# Patient Record
Sex: Male | Born: 1970 | Race: White | Hispanic: No | Marital: Married | Smoking: Never smoker
Health system: Southern US, Community
[De-identification: ages and names within clinical notes are randomized; demographics above are authoritative.]

## PROBLEM LIST (undated history)

## (undated) DIAGNOSIS — K5900 Constipation, unspecified: Secondary | ICD-10-CM

## (undated) DIAGNOSIS — R1310 Dysphagia, unspecified: Secondary | ICD-10-CM

## (undated) DIAGNOSIS — J342 Deviated nasal septum: Secondary | ICD-10-CM

## (undated) DIAGNOSIS — K589 Irritable bowel syndrome without diarrhea: Secondary | ICD-10-CM

## (undated) DIAGNOSIS — E785 Hyperlipidemia, unspecified: Secondary | ICD-10-CM

## (undated) DIAGNOSIS — K219 Gastro-esophageal reflux disease without esophagitis: Secondary | ICD-10-CM

## (undated) DIAGNOSIS — K649 Unspecified hemorrhoids: Secondary | ICD-10-CM

## (undated) DIAGNOSIS — E559 Vitamin D deficiency, unspecified: Secondary | ICD-10-CM

## (undated) DIAGNOSIS — E509 Vitamin A deficiency, unspecified: Secondary | ICD-10-CM

## (undated) DIAGNOSIS — K5790 Diverticulosis of intestine, part unspecified, without perforation or abscess without bleeding: Secondary | ICD-10-CM

## (undated) HISTORY — DX: Vitamin D deficiency, unspecified: E55.9

## (undated) HISTORY — DX: Constipation, unspecified: K59.00

## (undated) HISTORY — DX: Dysphagia, unspecified: R13.10

## (undated) HISTORY — DX: Irritable bowel syndrome, unspecified: K58.9

## (undated) HISTORY — DX: Gastro-esophageal reflux disease without esophagitis: K21.9

## (undated) HISTORY — DX: Vitamin a deficiency, unspecified: E50.9

## (undated) HISTORY — DX: Diverticulosis of intestine, part unspecified, without perforation or abscess without bleeding: K57.90

## (undated) HISTORY — DX: Hyperlipidemia, unspecified: E78.5

## (undated) HISTORY — DX: Unspecified hemorrhoids: K64.9

## (undated) HISTORY — PX: WISDOM TOOTH EXTRACTION: SHX21

## (undated) HISTORY — DX: Deviated nasal septum: J34.2

---

## 2002-02-10 ENCOUNTER — Emergency Department (HOSPITAL_COMMUNITY): Admission: EM | Admit: 2002-02-10 | Discharge: 2002-02-10 | Payer: Self-pay | Admitting: Emergency Medicine

## 2002-02-10 ENCOUNTER — Encounter: Payer: Self-pay | Admitting: Emergency Medicine

## 2002-02-14 ENCOUNTER — Encounter: Payer: Self-pay | Admitting: Emergency Medicine

## 2002-02-14 ENCOUNTER — Ambulatory Visit (HOSPITAL_COMMUNITY): Admission: RE | Admit: 2002-02-14 | Discharge: 2002-02-14 | Payer: Self-pay | Admitting: Emergency Medicine

## 2003-07-16 ENCOUNTER — Ambulatory Visit (HOSPITAL_BASED_OUTPATIENT_CLINIC_OR_DEPARTMENT_OTHER): Admission: RE | Admit: 2003-07-16 | Discharge: 2003-07-16 | Payer: Self-pay | Admitting: Otolaryngology

## 2005-04-10 HISTORY — PX: NASAL SEPTUM SURGERY: SHX37

## 2006-09-12 ENCOUNTER — Encounter: Admission: RE | Admit: 2006-09-12 | Discharge: 2006-09-12 | Payer: Self-pay | Admitting: Otolaryngology

## 2007-10-04 ENCOUNTER — Encounter: Admission: RE | Admit: 2007-10-04 | Discharge: 2007-10-04 | Payer: Self-pay | Admitting: Gastroenterology

## 2007-12-03 HISTORY — PX: ESOPHAGOGASTRODUODENOSCOPY: SHX1529

## 2010-05-01 ENCOUNTER — Encounter: Payer: Self-pay | Admitting: Gastroenterology

## 2010-08-26 NOTE — Op Note (Signed)
NAME:  Scott Hensley, Scott Hensley                              ACCOUNT NO.:  [id number]   MEDICAL RECORD NO.:  [id number]                   PATIENT TYPE:  AMB   LOCATION:  DSC                                  FACILITY:  [facility]   PHYSICIAN:  Onalee Hua L. Annalee Genta, M.D.            DATE OF BIRTH:  1970/06/01   DATE OF PROCEDURE:  07/16/2003  DATE OF DISCHARGE:                                 OPERATIVE REPORT   PREOPERATIVE DIAGNOSIS:  1. Deviated nasal septum with nasal obstruction.  2. Bilateral inferior turbinate hypertrophy.   POSTOPERATIVE DIAGNOSIS:  1. Deviated nasal septum with nasal obstruction.  2. Bilateral inferior turbinate hypertrophy.   PROCEDURE:  1. Nasal septoplasty.  2. Bilateral inferior turbinate intramural cautery.   ANESTHESIA:  General endotracheal anesthesia.   SURGEON:  Kinnie Scales. Annalee Genta, M.D.   COMPLICATIONS:  None.   ESTIMATED BLOOD LOSS:  Approximately 50 mL.   DISPOSITION:  The patient is transferred from the operating room to the  recovery room in stable condition.  The patient will follow up in my office  in one week for postoperative care.   BRIEF HISTORY:  Mr. Heidrick is a 40 year old white male who was referred for  evaluation of progressive nasal airway obstruction.  Examination in the  office revealed a severely deviated nasal septum and turbinate hypertrophy.  The patient was treated with topical nasal steroids, antihistamines, and  decongestants and, despite this therapy, continued to have significant  problems with airway obstruction.  Given his history and examination, I  recommended that we consider him for nasal septoplasty and turbinate  reduction under general anesthesia.  The risks, benefits, and possible  complications of the procedure were discussed in detail with the patient who  understood and concurred with our plan for surgery which was scheduled as  above.   SURGICAL PROCEDURE:  The patient was brought to the operating room on July 16, 2003, and placed in supine position on the operating table. General  endotracheal anesthesia was established without difficulty and the patient  was adequately anesthetized.  His nose was injected with a total of 10 mL of  1% lidocaine with 1:100,000 epinephrine injected in a submucosal fashion  along the nasal septum and inferior turbinates bilaterally.  The patient's  nose was then packed with Afrin soaked cottonoid pledgets which were left in  place for approximately 10 minutes for vasoconstriction.  He was prepped and  draped in a sterile fashion.   The surgical procedure was begun by creating a left hemitransfixion incision  after removal of the cottonoid pledgets.  The hemitransfixion incision was  carried through the mucosa and underlying submucosa with a #15 scalpel.  A  mucoperichondrial flap was elevated along the patient's left hand side using  suction elevator.  The bony cartilaginous junction was crossed at the  midline and mucoperiosteal flap was elevated along the patient's right hand  side.  Deviated bone and cartilage in the mid and posterior aspects of the  nasal septum were then removed using a swivel knife.  Dorsal and columellar  septal cartilage was not deviated and this was left intact.  The patient had  a very large cartilaginous and bony septal spur on the left hand side which  was mobilized with a 4 mm osteotome after elevation of the overlying mucosa  which was preserved.  Deviated bone and cartilage along the septal spur were  resected and the septum was brought to the midline.  The resected cartilage  was morselized and returned to the mucoperichondrial pocket and the  mucoperichondrial flaps were reapproximated with a 4-0 gut suture on a Keith  needle in horizontal mattress fashion.  The hemitransfixion suture was  closed with the same stitch and bilateral Doyle nasal septal splints were  placed after the application of Bactroban ointment and sutured in  position  with a 3-0 Ethilon suture.   Bilateral inferior turbinate intramural cautery was performed with the  cautery set at 12 watts.  Two passes were made in a submucosal fashion in  each inferior turbinate.  When the turbinates had been adequately  cauterized, they were out fractured to create a more patent nasal cavity.  The patient's nasal cavity was then irrigated and suctioned.  An orogastric  tube was passed and the stomach contents were aspirated.  The oral cavity  was suctioned.  The patient was awakened from his anesthetic, extubated, and  transferred from the operating room to the recovery room in stable  condition.  There were no complications.  Blood loss less than 50 mL.                                               Kinnie Scales. Annalee Genta, M.D.    DLS/MEDQ  D:  04/54/0981  T:  07/16/2003  Job:  [id number]

## 2010-09-09 LAB — HM COLONOSCOPY

## 2010-10-09 DIAGNOSIS — K5790 Diverticulosis of intestine, part unspecified, without perforation or abscess without bleeding: Secondary | ICD-10-CM

## 2010-10-09 HISTORY — DX: Diverticulosis of intestine, part unspecified, without perforation or abscess without bleeding: K57.90

## 2010-10-09 HISTORY — PX: COLONOSCOPY: SHX174

## 2010-11-07 ENCOUNTER — Other Ambulatory Visit: Payer: Self-pay | Admitting: Gastroenterology

## 2010-11-08 ENCOUNTER — Ambulatory Visit
Admission: RE | Admit: 2010-11-08 | Discharge: 2010-11-08 | Disposition: A | Discharge status: Home / Self Care | Payer: BC Managed Care – PPO | Source: Ambulatory Visit | Attending: Gastroenterology | Admitting: Gastroenterology

## 2010-11-08 MED ORDER — IOHEXOL 300 MG/ML  SOLN
125.0000 mL | Freq: Once | INTRAMUSCULAR | Status: AC | PRN
  Administered 2010-11-08: 125 mL via INTRAVENOUS

## 2011-07-19 ENCOUNTER — Encounter: Payer: Self-pay | Admitting: Family Medicine

## 2011-07-19 ENCOUNTER — Ambulatory Visit (INDEPENDENT_AMBULATORY_CARE_PROVIDER_SITE_OTHER): Payer: BC Managed Care – PPO | Admitting: Family Medicine

## 2011-07-19 VITALS — BP 100/68 | HR 68 | Ht 76.0 in | Wt 278.0 lb

## 2011-07-19 DIAGNOSIS — R141 Gas pain: Secondary | ICD-10-CM

## 2011-07-19 DIAGNOSIS — H1030 Unspecified acute conjunctivitis, unspecified eye: Secondary | ICD-10-CM

## 2011-07-19 DIAGNOSIS — E7800 Pure hypercholesterolemia, unspecified: Secondary | ICD-10-CM | POA: Insufficient documentation

## 2011-07-19 DIAGNOSIS — K5730 Diverticulosis of large intestine without perforation or abscess without bleeding: Secondary | ICD-10-CM

## 2011-07-19 DIAGNOSIS — R7301 Impaired fasting glucose: Secondary | ICD-10-CM | POA: Insufficient documentation

## 2011-07-19 DIAGNOSIS — R140 Abdominal distension (gaseous): Secondary | ICD-10-CM | POA: Insufficient documentation

## 2011-07-19 DIAGNOSIS — H6980 Other specified disorders of Eustachian tube, unspecified ear: Secondary | ICD-10-CM

## 2011-07-19 DIAGNOSIS — K5790 Diverticulosis of intestine, part unspecified, without perforation or abscess without bleeding: Secondary | ICD-10-CM | POA: Insufficient documentation

## 2011-07-19 MED ORDER — POLYMYXIN B-TRIMETHOPRIM 10000-0.1 UNIT/ML-% OP SOLN: 1.0000 [drp] | OPHTHALMIC | Status: AC

## 2011-07-19 NOTE — Progress Notes (Signed)
Chief complaint:  Bloated and gassy feeling, and occasional constipation. Has seen GI for these issues but would like to discuss. Thinks he may have a hemmorhoid. Also has pink eye and his ears feel full. Right leg has a pain that shoots down his leg when he gets out of bed  HPI: Bloating and gassiness--has seen GI.  Hycosamine didn't really help. He has been diagnosed with irritable bowel syndrome and diverticulosis.  Hasn't tried Gas-X for his discomfort.  Also complains of constipation--strains, sometimes has bright red blood on toilet paper after having bowel movement.  Doesn't feel like he completely empties.  Also has some rectal itching.  Hyperlipidemia--previously took pravastatin.  Has been off meds for about a year.  Has been eating better since lent, and has lost 15-20 pounds.  Recalls that sugar was slightly elevated on last check also. Not fasting today.  L eye started getting red yesterday. Got worse throughout the day, and this morning was crusted over.  +itchy today. Denies sick contact.  Also complains of L ear pressure--ears plug and pop a lot, even in elevators.  Hasn't any any congestion recently.  Past Medical History  Diagnosis Date  . Diverticulosis     Dr. Randa Evens  . IBS (irritable bowel syndrome)     Dr. Randa Evens  . Hyperlipidemia     Past Surgical History  Procedure Date  . Colonoscopy 10/2010    Dr. Randa Evens  . Esophagogastroduodenoscopy 2010  . Nasal septum surgery 2007    History   Social History  . Marital Status: Married    Spouse Name: N/A    Number of Children: 4  . Years of Education: N/A   Occupational History  . attorney    Social History Main Topics  . Smoking status: Never Smoker   . Smokeless tobacco: Never Used  . Alcohol Use: Yes     5-6 drinks every 2 weeks.  . Drug Use: No  . Sexually Active: Not on file   Other Topics Concern  . Not on file   Social History Narrative   Lives with wife, 4 kids, [place] Terrier puppy     Family History  Problem Relation Age of Onset  . Cancer Mother     skin  . Colon polyps Mother   . Thyroid disease Mother     ?nodule (in 51's)  . Diabetes Father   . Hyperlipidemia Father   . Heart disease Father     CABG at 44  . Urolithiasis Father   . Ulcers Father   . Cancer Father     skin  . Hyperlipidemia Brother   . Cancer Maternal Grandmother     colon cancer (mid 29's)   No current outpatient prescriptions on file prior to visit.   No Known Allergies  ROS:  Denies urinary complaints.  Minimal burning only after intercourse, short-lived.  No drip.  Denies congestion, sinus pain, fevers, cough, shortness of breath, chest pain  PHYSICAL EXAM: BP 100/68  Pulse 68  Ht 6\' 4"  (1.93 m)  Wt 278 lb (126.1 kg)  BMI 33.84 kg/m2 Well developed, pleasant, overweight male in no distress HEENT: L eye-moderate conjunctival injection. PERRL.  No crusting/purulence.  TM's and EAC's normal.  OP clear.  Neck: no lymphadenopathy, thyromegaly or mass Heart: regular rate and rhythm without murmur Lungs: clear bilaterally Abdomen: soft, normal active bowel sounds.  Nontender.  No organomegaly or mass Extremities: no edema Skin: no rash Psych: normal mood, affect, hygiene and grooming Rectal  exam: External hemorrhoids posteriorly, mildly inflamed, not bleeding.  ASSESSMENT/PLAN:  1. Conjunctivitis, acute  trimethoprim-polymyxin b (POLYTRIM) ophthalmic solution  2. Pure hypercholesterolemia  Lipid panel  3. Impaired fasting glucose  Comprehensive metabolic panel  4. Gassiness    5. Bloating    6. Diverticulosis    7. Eustachian tube dysfunction     Bloating and gassiness--discussed diet at length.  Consider trial of lactose-free diet.  Discussed Beano vs Simethicone  Hyperlipidemia--continue healthy diet and recheck lipids (and c-met) in 1 month.  May need to restart pravastatin. Old records are being requested.  Diverticulosis--continue high fiber diet (different fiber  sources may contribute to bloating/gassiness--need to experiment with fiber sources)  Rectal bleeding--due to external hemorrhoids +/- anal fissure. Add stool softener, drink plenty of fluids. Use moist, flushable wipes to clean after bowel movements.  Consider use of Tucks as needed, or hemorrhoidal creams prn, especially with itching.  L conjunctivitis--likely viral.  Discussed contagious nature--strict handwashing  Eustachian tube dysfunction--no evidence of infection.  F/u for CPE; fasting labs in the next month or two.

## 2011-07-19 NOTE — Patient Instructions (Signed)
Bloating Bloating is the feeling of fullness in your belly. You may feel as though your pants are too tight. Often the cause of bloating is overeating, retaining fluids, or having gas in your bowel. It is also caused by swallowing air and eating foods that cause gas. Irritable bowel syndrome is one of the most common causes of bloating. Constipation is also a common cause. Sometimes more serious problems can cause bloating. SYMPTOMS  Usually there is a feeling of fullness, as though your abdomen is bulged out. There may be mild discomfort.  DIAGNOSIS  Usually no particular testing is necessary for most bloating. If the condition persists and seems to become worse, your caregiver may do additional testing.  TREATMENT   There is no direct treatment for bloating.   Do not put gas into the bowel. Avoid chewing gum and sucking on candy. These tend to make you swallow air. Swallowing air can also be a nervous habit. Try to avoid this.   Avoiding high residue diets will help. Eat foods with soluble fibers (examples include root vegetables, apples, or barley) and substitute dairy products with soy and rice products. This helps irritable bowel syndrome.   If constipation is the cause, then a high residue diet with more fiber will help.   Avoid carbonated beverages.   Over-the-counter preparations are available that help reduce gas. Your pharmacist can help you with this.  SEEK MEDICAL CARE IF:   Bloating continues and seems to be getting worse.   You notice a weight gain.   You have a weight loss but the bloating is getting worse.   You have changes in your bowel habits or develop nausea or vomiting.  SEEK IMMEDIATE MEDICAL CARE IF:   You develop shortness of breath or swelling in your legs.   You have an increase in abdominal pain or develop chest pain.  Document Released: 01/25/2006 Document Revised: 03/16/2011 Document Reviewed: 03/15/2007 [facility] Patient Information 2012 [place],  [place].  Your gas/bloating may be contributed by types of fiber, raw vegetables, ie beans.   Consider trial of lactose-free diet.  Discussed Beano (before meals) vs Simethicone (as needed for gas pains)  High cholesterol--continue healthy diet and recheck lipids (and c-met) in 1 month.  May need to restart pravastatin. Old records are being requested.  Diverticulosis--continue high fiber diet  Rectal bleeding--you have external hemorrhoid.  Add stool softener, drink plenty of fluids. Use moist, flushable wipes to clean after bowel movements.  Consider use of Tucks as needed, or hemorrhoidal creams prn, especially with itching.  Cholesterol Control Diet Cholesterol levels in your body are determined significantly by your diet. Cholesterol levels may also be related to heart disease. The following material helps to explain this relationship and discusses what you can do to help keep your heart healthy. Not all cholesterol is bad. Low-density lipoprotein (LDL) cholesterol is the "bad" cholesterol. It may cause fatty deposits to build up inside your arteries. High-density lipoprotein (HDL) cholesterol is "good." It helps to remove the "bad" LDL cholesterol from your blood. Cholesterol is a very important risk factor for heart disease. Other risk factors are high blood pressure, smoking, stress, heredity, and weight. The heart muscle gets its supply of blood through the coronary arteries. If your LDL cholesterol is high and your HDL cholesterol is low, you are at risk for having fatty deposits build up in your coronary arteries. This leaves less room through which blood can flow. Without sufficient blood and oxygen, the heart muscle cannot function  properly and you may feel chest pains (angina pectoris). When a coronary artery closes up entirely, a part of the heart muscle may die, causing a heart attack (myocardial infarction). CHECKING CHOLESTEROL When your caregiver sends your blood to a lab to be  analyzed for cholesterol, a complete lipid (fat) profile may be done. With this test, the total amount of cholesterol and levels of LDL and HDL are determined. Triglycerides are a type of fat that circulates in the blood and can also be used to determine heart disease risk. The list below describes what the numbers should be: Test: Total Cholesterol.  Less than 200 mg/dl.  Test: LDL "bad cholesterol."  Less than 100 mg/dl.   Less than 70 mg/dl if you are at very high risk of a heart attack or sudden cardiac death.  Test: HDL "good cholesterol."  Greater than 50 mg/dl for women.   Greater than 40 mg/dl for men.  Test: Triglycerides.  Less than 150 mg/dl.  CONTROLLING CHOLESTEROL WITH DIET Although exercise and lifestyle factors are important, your diet is key. That is because certain foods are known to raise cholesterol and others to lower it. The goal is to balance foods for their effect on cholesterol and more importantly, to replace saturated and trans fat with other types of fat, such as monounsaturated fat, polyunsaturated fat, and omega-3 fatty acids. On average, a person should consume no more than 15 to 17 g of saturated fat daily. Saturated and trans fats are considered "bad" fats, and they will raise LDL cholesterol. Saturated fats are primarily found in animal products such as meats, butter, and cream. However, that does not mean you need to sacrifice all your favorite foods. Today, there are good tasting, low-fat, low-cholesterol substitutes for most of the things you like to eat. Choose low-fat or nonfat alternatives. Choose round or loin cuts of red meat, since these types of cuts are lowest in fat and cholesterol. Chicken (without the skin), fish, veal, and ground [place] breast are excellent choices. Eliminate fatty meats, such as hot dogs and salami. Even shellfish have little or no saturated fat. Have a 3 oz (85 g) portion when you eat lean meat, poultry, or fish. Trans fats are  also called "partially hydrogenated oils." They are oils that have been scientifically manipulated so that they are solid at room temperature resulting in a longer shelf life and improved taste and texture of foods in which they are added. Trans fats are found in stick margarine, some tub margarines, cookies, crackers, and baked goods.  When baking and cooking, oils are an excellent substitute for butter. The monounsaturated oils are especially beneficial since it is believed they lower LDL and raise HDL. The oils you should avoid entirely are saturated tropical oils, such as coconut and palm.  Remember to eat liberally from food groups that are naturally free of saturated and trans fat, including fish, fruit, vegetables, beans, grains (barley, rice, couscous, bulgur wheat), and pasta (without cream sauces).  IDENTIFYING FOODS THAT LOWER CHOLESTEROL  Soluble fiber may lower your cholesterol. This type of fiber is found in fruits such as apples, vegetables such as broccoli, potatoes, and carrots, legumes such as beans, peas, and lentils, and grains such as barley. Foods fortified with plant sterols (phytosterol) may also lower cholesterol. You should eat at least 2 g per day of these foods for a cholesterol lowering effect.  Read package labels to identify low-saturated fats, trans fats free, and low-fat foods at the supermarket. Select  cheeses that have only 2 to 3 g saturated fat per ounce. Use a heart-healthy tub margarine that is free of trans fats or partially hydrogenated oil. When buying baked goods (cookies, crackers), avoid partially hydrogenated oils. Breads and muffins should be made from whole grains (whole-wheat or whole oat flour, instead of "flour" or "enriched flour"). Buy non-creamy canned soups with reduced salt and no added fats.  FOOD PREPARATION TECHNIQUES  Never deep-fry. If you must fry, either stir-fry, which uses very little fat, or use non-stick cooking sprays. When possible, broil,  bake, or roast meats, and steam vegetables. Instead of dressing vegetables with butter or margarine, use lemon and herbs, applesauce and cinnamon (for squash and sweet potatoes), nonfat yogurt, salsa, and low-fat dressings for salads.  LOW-SATURATED FAT / LOW-FAT FOOD SUBSTITUTES Meats / Saturated Fat (g)  Avoid: Steak, marbled (3 oz/85 g) / 11 g   Choose: Steak, lean (3 oz/85 g) / 4 g   Avoid: Hamburger (3 oz/85 g) / 7 g   Choose: Hamburger, lean (3 oz/85 g) / 5 g   Avoid: Ham (3 oz/85 g) / 6 g   Choose: Ham, lean cut (3 oz/85 g) / 2.4 g   Avoid: Chicken, with skin, dark meat (3 oz/85 g) / 4 g   Choose: Chicken, skin removed, dark meat (3 oz/85 g) / 2 g   Avoid: Chicken, with skin, light meat (3 oz/85 g) / 2.5 g   Choose: Chicken, skin removed, light meat (3 oz/85 g) / 1 g  Dairy / Saturated Fat (g)  Avoid: Whole milk (1 cup) / 5 g   Choose: Low-fat milk, 2% (1 cup) / 3 g   Choose: Low-fat milk, 1% (1 cup) / 1.5 g   Choose: Skim milk (1 cup) / 0.3 g   Avoid: Hard cheese (1 oz/28 g) / 6 g   Choose: Skim milk cheese (1 oz/28 g) / 2 to 3 g   Avoid: Cottage cheese, 4% fat (1 cup) / 6.5 g   Choose: Low-fat cottage cheese, 1% fat (1 cup) / 1.5 g   Avoid: Ice cream (1 cup) / 9 g   Choose: Sherbet (1 cup) / 2.5 g   Choose: Nonfat frozen yogurt (1 cup) / 0.3 g   Choose: Frozen fruit bar / trace   Avoid: Whipped cream (1 tbs) / 3.5 g   Choose: Nondairy whipped topping (1 tbs) / 1 g  Condiments / Saturated Fat (g)  Avoid: Mayonnaise (1 tbs) / 2 g   Choose: Low-fat mayonnaise (1 tbs) / 1 g   Avoid: Butter (1 tbs) / 7 g   Choose: Extra light margarine (1 tbs) / 1 g   Avoid: Coconut oil (1 tbs) / 11.8 g   Choose: Olive oil (1 tbs) / 1.8 g   Choose: Corn oil (1 tbs) / 1.7 g   Choose: Safflower oil (1 tbs) / 1.2 g   Choose: Sunflower oil (1 tbs) / 1.4 g   Choose: Soybean oil (1 tbs) / 2.4 g   Choose: Canola oil (1 tbs) / 1 g  Document Released: 03/27/2005  Document Revised: 03/16/2011 Document Reviewed: 09/15/2010 [facility] Patient Information 2012 [place], [place].

## 2011-08-14 ENCOUNTER — Encounter: Payer: Self-pay | Admitting: *Deleted

## 2011-08-21 ENCOUNTER — Other Ambulatory Visit: Payer: BC Managed Care – PPO

## 2011-08-21 DIAGNOSIS — R7301 Impaired fasting glucose: Secondary | ICD-10-CM

## 2011-08-21 DIAGNOSIS — E7800 Pure hypercholesterolemia, unspecified: Secondary | ICD-10-CM

## 2011-08-21 LAB — COMPREHENSIVE METABOLIC PANEL
ALT: 30 U/L (ref 0–53)
CO2: 25 mEq/L (ref 19–32)
Sodium: 139 mEq/L (ref 135–145)
Total Bilirubin: 0.5 mg/dL (ref 0.3–1.2)
Total Protein: 6.7 g/dL (ref 6.0–8.3)

## 2011-08-21 LAB — LIPID PANEL
HDL: 38 mg/dL — ABNORMAL LOW (ref >39)
LDL Cholesterol: 193 mg/dL — ABNORMAL HIGH (ref 0–99)

## 2011-09-14 ENCOUNTER — Ambulatory Visit (INDEPENDENT_AMBULATORY_CARE_PROVIDER_SITE_OTHER): Payer: BC Managed Care – PPO | Admitting: Family Medicine

## 2011-09-14 ENCOUNTER — Encounter: Payer: Self-pay | Admitting: Family Medicine

## 2011-09-14 VITALS — BP 102/64 | HR 64 | Ht 76.0 in | Wt 276.0 lb

## 2011-09-14 DIAGNOSIS — E7800 Pure hypercholesterolemia, unspecified: Secondary | ICD-10-CM

## 2011-09-14 MED ORDER — ATORVASTATIN CALCIUM 20 MG PO TABS: 20.0000 mg | ORAL_TABLET | Freq: Every day | ORAL | Status: DC

## 2011-09-14 NOTE — Patient Instructions (Addendum)
Change to oil-based dressings; cut back on egg yolks (can try egg whites, or eggbeaters), cheese, red meat.  Try [place] versions of bacon, sausage, burgers, etc.  Try and get at least 30 minutes of exercise daily. Try and take fish oil, 3000 mg daily as this might also help raise the HDL  Call if having side effects from the cholesterol medication; if no problems, then return in 2-3 months for labs.  Cholesterol Control Diet Cholesterol levels in your body are determined significantly by your diet. Cholesterol levels may also be related to heart disease. The following material helps to explain this relationship and discusses what you can do to help keep your heart healthy. Not all cholesterol is bad. Low-density lipoprotein (LDL) cholesterol is the "bad" cholesterol. It may cause fatty deposits to build up inside your arteries. High-density lipoprotein (HDL) cholesterol is "good." It helps to remove the "bad" LDL cholesterol from your blood. Cholesterol is a very important risk factor for heart disease. Other risk factors are high blood pressure, smoking, stress, heredity, and weight. The heart muscle gets its supply of blood through the coronary arteries. If your LDL cholesterol is high and your HDL cholesterol is low, you are at risk for having fatty deposits build up in your coronary arteries. This leaves less room through which blood can flow. Without sufficient blood and oxygen, the heart muscle cannot function properly and you may feel chest pains (angina pectoris). When a coronary artery closes up entirely, a part of the heart muscle may die, causing a heart attack (myocardial infarction). CHECKING CHOLESTEROL When your caregiver sends your blood to a lab to be analyzed for cholesterol, a complete lipid (fat) profile may be done. With this test, the total amount of cholesterol and levels of LDL and HDL are determined. Triglycerides are a type of fat that circulates in the blood and can also be used  to determine heart disease risk. The list below describes what the numbers should be: Test: Total Cholesterol.  Less than 200 mg/dl.  Test: LDL "bad cholesterol."  Less than 100 mg/dl.   Less than 70 mg/dl if you are at very high risk of a heart attack or sudden cardiac death.  Test: HDL "good cholesterol."  Greater than 50 mg/dl for women.   Greater than 40 mg/dl for men.  Test: Triglycerides.  Less than 150 mg/dl.  CONTROLLING CHOLESTEROL WITH DIET Although exercise and lifestyle factors are important, your diet is key. That is because certain foods are known to raise cholesterol and others to lower it. The goal is to balance foods for their effect on cholesterol and more importantly, to replace saturated and trans fat with other types of fat, such as monounsaturated fat, polyunsaturated fat, and omega-3 fatty acids. On average, a person should consume no more than 15 to 17 g of saturated fat daily. Saturated and trans fats are considered "bad" fats, and they will raise LDL cholesterol. Saturated fats are primarily found in animal products such as meats, butter, and cream. However, that does not mean you need to sacrifice all your favorite foods. Today, there are good tasting, low-fat, low-cholesterol substitutes for most of the things you like to eat. Choose low-fat or nonfat alternatives. Choose round or loin cuts of red meat, since these types of cuts are lowest in fat and cholesterol. Chicken (without the skin), fish, veal, and ground [place] breast are excellent choices. Eliminate fatty meats, such as hot dogs and salami. Even shellfish have little or no saturated  fat. Have a 3 oz (85 g) portion when you eat lean meat, poultry, or fish. Trans fats are also called "partially hydrogenated oils." They are oils that have been scientifically manipulated so that they are solid at room temperature resulting in a longer shelf life and improved taste and texture of foods in which they are added.  Trans fats are found in stick margarine, some tub margarines, cookies, crackers, and baked goods.  When baking and cooking, oils are an excellent substitute for butter. The monounsaturated oils are especially beneficial since it is believed they lower LDL and raise HDL. The oils you should avoid entirely are saturated tropical oils, such as coconut and palm.  Remember to eat liberally from food groups that are naturally free of saturated and trans fat, including fish, fruit, vegetables, beans, grains (barley, rice, couscous, bulgur wheat), and pasta (without cream sauces).  IDENTIFYING FOODS THAT LOWER CHOLESTEROL  Soluble fiber may lower your cholesterol. This type of fiber is found in fruits such as apples, vegetables such as broccoli, potatoes, and carrots, legumes such as beans, peas, and lentils, and grains such as barley. Foods fortified with plant sterols (phytosterol) may also lower cholesterol. You should eat at least 2 g per day of these foods for a cholesterol lowering effect.  Read package labels to identify low-saturated fats, trans fats free, and low-fat foods at the supermarket. Select cheeses that have only 2 to 3 g saturated fat per ounce. Use a heart-healthy tub margarine that is free of trans fats or partially hydrogenated oil. When buying baked goods (cookies, crackers), avoid partially hydrogenated oils. Breads and muffins should be made from whole grains (whole-wheat or whole oat flour, instead of "flour" or "enriched flour"). Buy non-creamy canned soups with reduced salt and no added fats.  FOOD PREPARATION TECHNIQUES  Never deep-fry. If you must fry, either stir-fry, which uses very little fat, or use non-stick cooking sprays. When possible, broil, bake, or roast meats, and steam vegetables. Instead of dressing vegetables with butter or margarine, use lemon and herbs, applesauce and cinnamon (for squash and sweet potatoes), nonfat yogurt, salsa, and low-fat dressings for salads.    LOW-SATURATED FAT / LOW-FAT FOOD SUBSTITUTES Meats / Saturated Fat (g)  Avoid: Steak, marbled (3 oz/85 g) / 11 g   Choose: Steak, lean (3 oz/85 g) / 4 g   Avoid: Hamburger (3 oz/85 g) / 7 g   Choose: Hamburger, lean (3 oz/85 g) / 5 g   Avoid: Ham (3 oz/85 g) / 6 g   Choose: Ham, lean cut (3 oz/85 g) / 2.4 g   Avoid: Chicken, with skin, dark meat (3 oz/85 g) / 4 g   Choose: Chicken, skin removed, dark meat (3 oz/85 g) / 2 g   Avoid: Chicken, with skin, light meat (3 oz/85 g) / 2.5 g   Choose: Chicken, skin removed, light meat (3 oz/85 g) / 1 g  Dairy / Saturated Fat (g)  Avoid: Whole milk (1 cup) / 5 g   Choose: Low-fat milk, 2% (1 cup) / 3 g   Choose: Low-fat milk, 1% (1 cup) / 1.5 g   Choose: Skim milk (1 cup) / 0.3 g   Avoid: Hard cheese (1 oz/28 g) / 6 g   Choose: Skim milk cheese (1 oz/28 g) / 2 to 3 g   Avoid: Cottage cheese, 4% fat (1 cup) / 6.5 g   Choose: Low-fat cottage cheese, 1% fat (1 cup) / 1.5 g  Avoid: Ice cream (1 cup) / 9 g   Choose: Sherbet (1 cup) / 2.5 g   Choose: Nonfat frozen yogurt (1 cup) / 0.3 g   Choose: Frozen fruit bar / trace   Avoid: Whipped cream (1 tbs) / 3.5 g   Choose: Nondairy whipped topping (1 tbs) / 1 g  Condiments / Saturated Fat (g)  Avoid: Mayonnaise (1 tbs) / 2 g   Choose: Low-fat mayonnaise (1 tbs) / 1 g   Avoid: Butter (1 tbs) / 7 g   Choose: Extra light margarine (1 tbs) / 1 g   Avoid: Coconut oil (1 tbs) / 11.8 g   Choose: Olive oil (1 tbs) / 1.8 g   Choose: Corn oil (1 tbs) / 1.7 g   Choose: Safflower oil (1 tbs) / 1.2 g   Choose: Sunflower oil (1 tbs) / 1.4 g   Choose: Soybean oil (1 tbs) / 2.4 g   Choose: Canola oil (1 tbs) / 1 g  Document Released: 03/27/2005 Document Revised: 03/16/2011 Document Reviewed: 09/15/2010 [facility] Patient Information 2012 [place], [place].

## 2011-09-14 NOTE — Progress Notes (Signed)
Chief Complaint  Patient presents with  . lab follow-up    lab follow-up    HPI: Patient presents for lab follow-up.  He was asked to return today to discuss his high cholesterol.  He previously took pravastatin without problems.  Never took any other statins.  Diet: eggs 3x/week (5 eggs/week), bacon, cheese, ice cream, blue cheese dressing.  Not getting regular exercise.   Past Medical History  Diagnosis Date  . Diverticulosis 10/2010    Dr. Randa Evens, seen on CT  . IBS (irritable bowel syndrome)     Dr. Randa Evens  . Hyperlipidemia     Past Surgical History  Procedure Date  . Colonoscopy 10/2010    Dr. Randa Evens  . Esophagogastroduodenoscopy 12/03/07    Dr.Edwards-Small hiatal hernia. Mod-severe esophagitis  . Nasal septum surgery 2007    History   Social History  . Marital Status: Married    Spouse Name: N/A    Number of Children: 4  . Years of Education: N/A   Occupational History  . attorney    Social History Main Topics  . Smoking status: Never Smoker   . Smokeless tobacco: Never Used  . Alcohol Use: Yes     5-6 drinks every 2 weeks.  . Drug Use: No  . Sexually Active: Not on file   Other Topics Concern  . Not on file   Social History Narrative   Lives with wife, 4 kids, [place] Terrier puppy    Family History  Problem Relation Age of Onset  . Cancer Mother     skin  . Colon polyps Mother   . Thyroid disease Mother     ?nodule (in 52's)  . Diabetes Father   . Hyperlipidemia Father   . Heart disease Father     CABG at 19  . Urolithiasis Father   . Ulcers Father   . Cancer Father     skin  . Hyperlipidemia Brother   . Cancer Maternal Grandmother     colon cancer (mid 10's)    No current outpatient prescriptions on file.  No Known Allergies  ROS: Denies myalgias, leg cramps.  Denies fevers, URI symptoms, chest pain, shortness of breath. Denies GI problems, skin rashes.  BP 102/64  Pulse 64  Ht 6\' 4"  (1.93 m)  Wt 276 lb (125.193 kg)  BMI  33.60 kg/m2 Well developed, pleasant, overweight male in no distress Remainder of exam limited to discussion, counseling.   Lab Results  Component Value Date   CHOL 255* 08/21/2011   HDL 38* 08/21/2011   LDLCALC 193* 08/21/2011   TRIG 122 08/21/2011   CHOLHDL 6.7 08/21/2011     Chemistry      Component Value Date/Time   NA 139 08/21/2011 0829   K 4.1 08/21/2011 0829   CL 105 08/21/2011 0829   CO2 25 08/21/2011 0829   BUN 17 08/21/2011 0829   CREATININE 1.07 08/21/2011 0829      Component Value Date/Time   CALCIUM 9.3 08/21/2011 0829   ALKPHOS 60 08/21/2011 0829   AST 23 08/21/2011 0829   ALT 30 08/21/2011 0829   BILITOT 0.5 08/21/2011 0829     ASSESSMENT/PLAN: 1. Pure hypercholesterolemia  atorvastatin (LIPITOR) 20 MG tablet, Hepatic function panel, Lipid panel   Counseled extensively regarding dietary changes, risks/side effects of medications.  Return in 2-3 months for fasting labs.  25-30 minutes face to face, counseling

## 2011-12-05 ENCOUNTER — Other Ambulatory Visit: Payer: BC Managed Care – PPO

## 2012-01-15 ENCOUNTER — Other Ambulatory Visit: Payer: Self-pay | Admitting: *Deleted

## 2012-01-15 ENCOUNTER — Other Ambulatory Visit: Payer: Self-pay | Admitting: Family Medicine

## 2012-01-15 DIAGNOSIS — E7800 Pure hypercholesterolemia, unspecified: Secondary | ICD-10-CM

## 2012-01-15 MED ORDER — ATORVASTATIN CALCIUM 20 MG PO TABS: 20.0000 mg | ORAL_TABLET | Freq: Every day | ORAL | Status: DC

## 2012-01-17 ENCOUNTER — Other Ambulatory Visit: Payer: BC Managed Care – PPO

## 2012-01-18 ENCOUNTER — Other Ambulatory Visit: Payer: BC Managed Care – PPO

## 2012-01-18 ENCOUNTER — Other Ambulatory Visit: Payer: Self-pay | Admitting: *Deleted

## 2012-01-18 DIAGNOSIS — E7800 Pure hypercholesterolemia, unspecified: Secondary | ICD-10-CM

## 2012-01-18 DIAGNOSIS — Z79899 Other long term (current) drug therapy: Secondary | ICD-10-CM

## 2012-01-19 ENCOUNTER — Other Ambulatory Visit: Payer: BC Managed Care – PPO

## 2012-01-19 DIAGNOSIS — Z79899 Other long term (current) drug therapy: Secondary | ICD-10-CM

## 2012-01-19 DIAGNOSIS — E7800 Pure hypercholesterolemia, unspecified: Secondary | ICD-10-CM

## 2012-01-20 LAB — LIPID PANEL
Cholesterol: 183 mg/dL (ref 0–200)
LDL Cholesterol: 125 mg/dL — ABNORMAL HIGH (ref 0–99)
VLDL: 20 mg/dL (ref 0–40)

## 2012-01-20 LAB — HEPATIC FUNCTION PANEL
ALT: 39 U/L (ref 0–53)
Bilirubin, Direct: 0.2 mg/dL (ref 0.0–0.3)
Indirect Bilirubin: 0.7 mg/dL (ref 0.0–0.9)

## 2012-01-22 ENCOUNTER — Other Ambulatory Visit: Payer: Self-pay | Admitting: *Deleted

## 2012-01-22 DIAGNOSIS — E7800 Pure hypercholesterolemia, unspecified: Secondary | ICD-10-CM

## 2012-01-22 MED ORDER — ATORVASTATIN CALCIUM 20 MG PO TABS: 20.0000 mg | ORAL_TABLET | Freq: Every day | ORAL | Status: DC

## 2012-04-16 ENCOUNTER — Other Ambulatory Visit: Payer: Self-pay | Admitting: Family Medicine

## 2012-09-16 IMAGING — CT CT ABD-PELV W/ CM
2 of 4 series · 17 of 46 positions shown, 19 images · IV contrast (READICAT/WATER & [ID] OMNI 300)
Comparison: None.

CLINICAL DATA: Left abdominal pain and bloating

CT ABDOMEN AND PELVIS WITH CONTRAST
TECHNIQUE: Multidetector CT imaging of the abdomen and pelvis was
performed following the standard protocol during bolus
administration of intravenous contrast.
Contrast: 125 ml Omni 300

[Series 100: routine abdomen · axial · 0.96mm/px · z∈[-425,+35]mm · 14 of 102 slices shown, 16 images]
[im 5/102  soft-tissue]
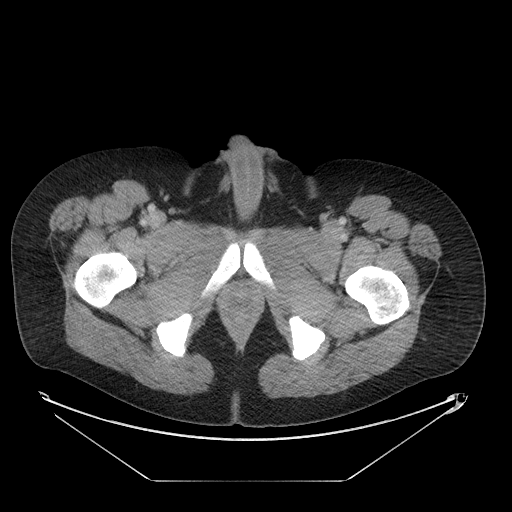
[im 5/102  bone]
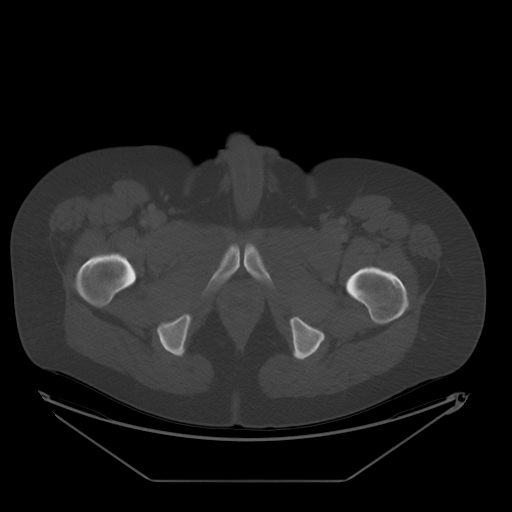
[im 13/102  soft-tissue]
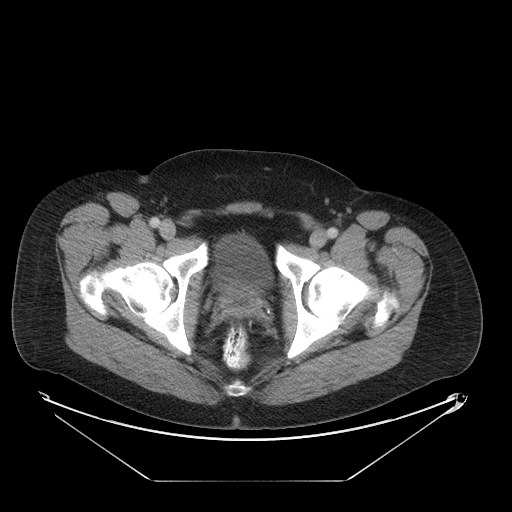
[im 21/102  soft-tissue]
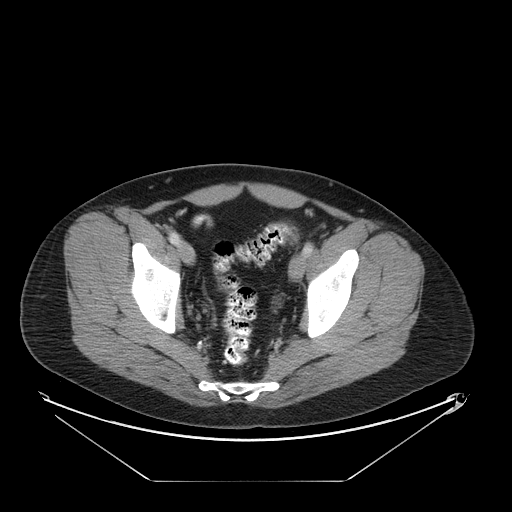
[im 29/102  soft-tissue]
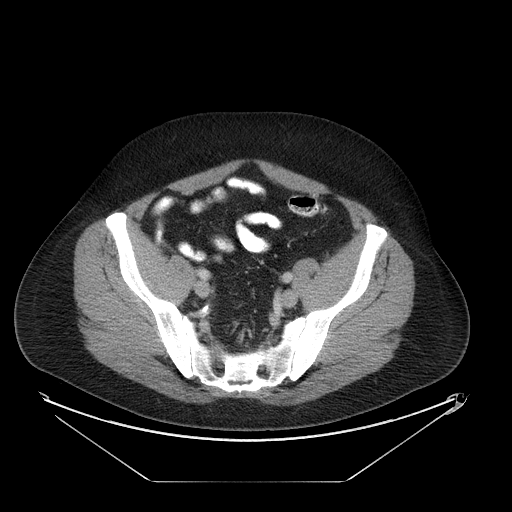
[im 33/102  soft-tissue]
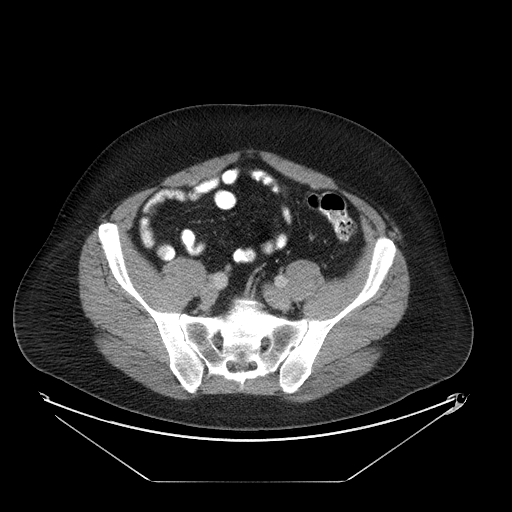
[im 41/102  soft-tissue]
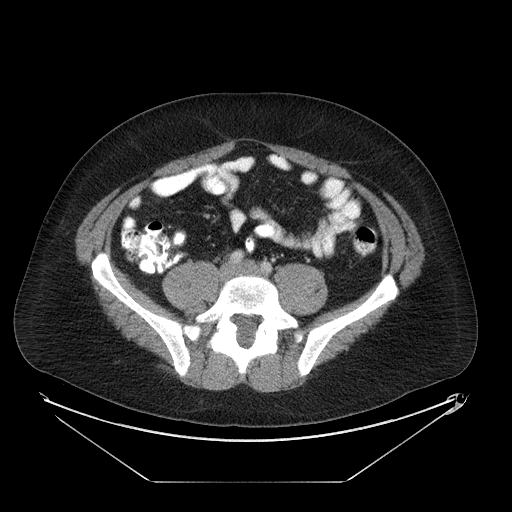
[im 49/102  soft-tissue]
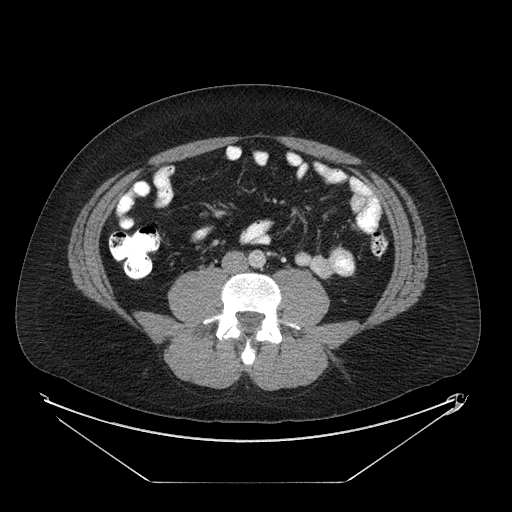
[im 53/102  soft-tissue]
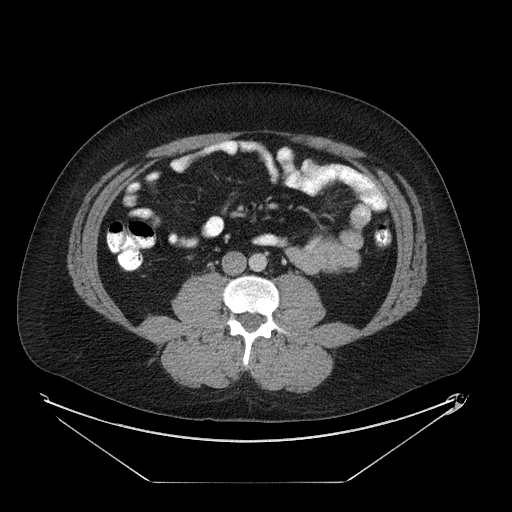
[im 61/102  soft-tissue]
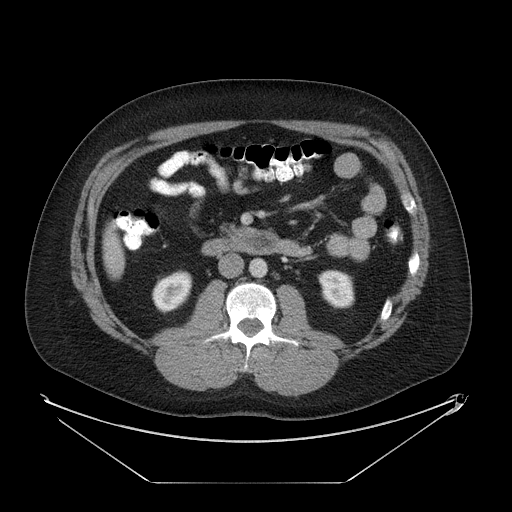
[im 61/102  bone]
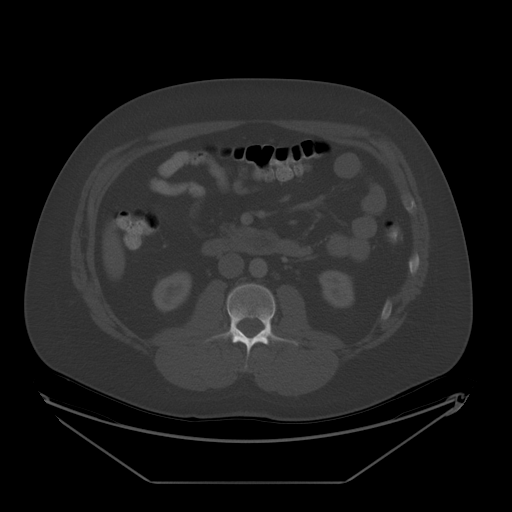
[im 69/102  soft-tissue]
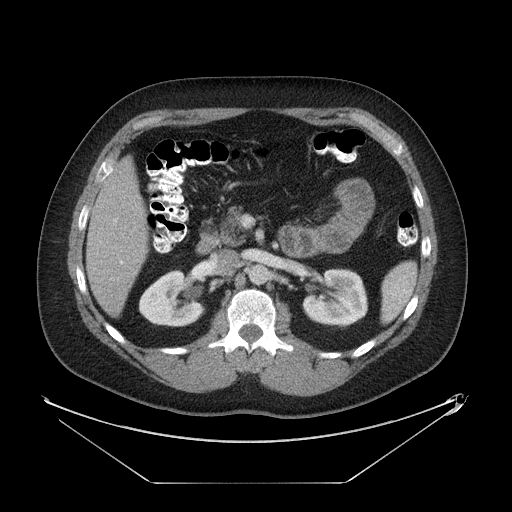
[im 77/102  soft-tissue]
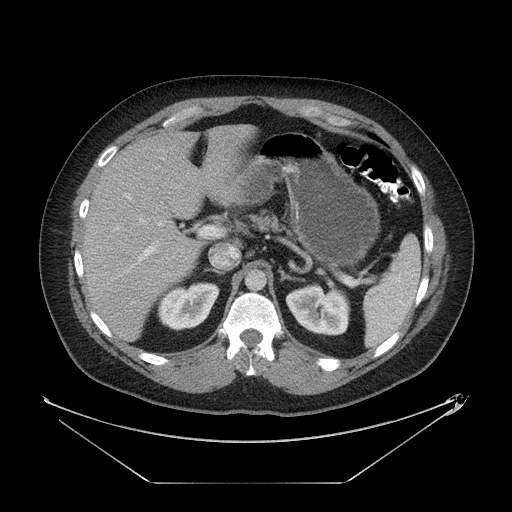
[im 81/102  soft-tissue]
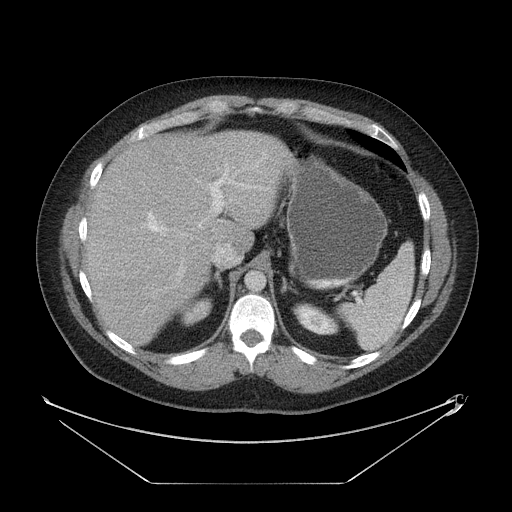
[im 89/102  soft-tissue]
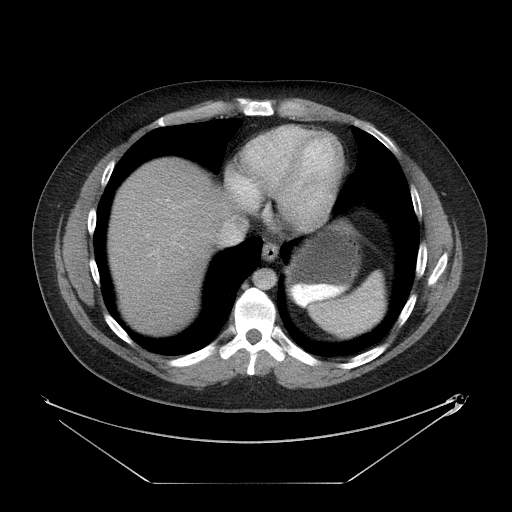
[im 97/102  soft-tissue]
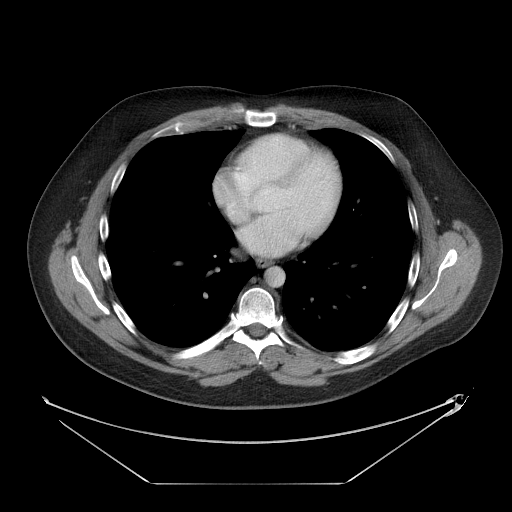

[Series 200: cor · coronal · 1.06mm/px · 3 of 145 slices shown]
[im 49/145  soft-tissue]
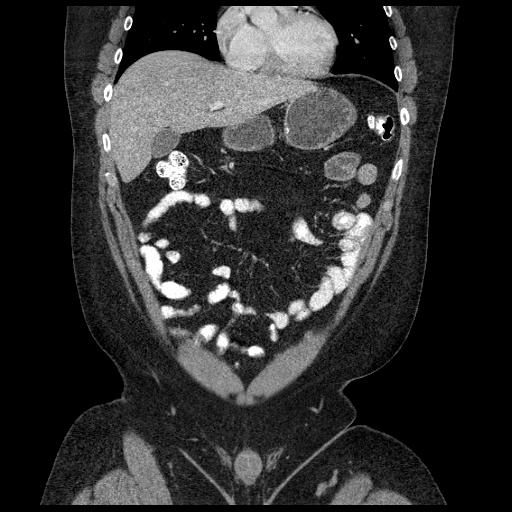
[im 65/145  soft-tissue]
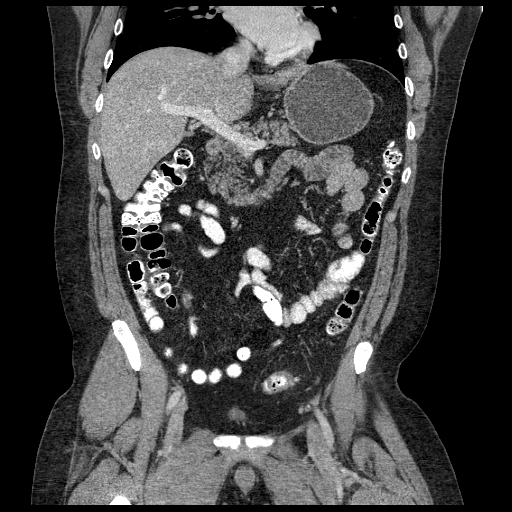
[im 81/145  soft-tissue]
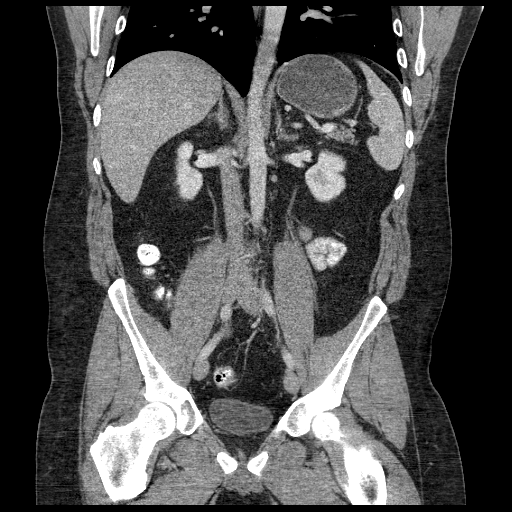

[17 of 46 positions shown; findings below may reference images not displayed]

FINDINGS: There is sigmoid diverticulosis without radiographic
evidence of diverticulitis at this time.  There is no evidence of
bowel obstruction or gross inflammation.  The appendix is seen in
the right lower quadrant and has a normal appearance.  There is no
adenopathy or free fluid within the abdomen or pelvis.  No
pneumoperitoneum.

The lung bases are clear.  The liver, gallbladder, spleen,
pancreas, adrenal glands, kidneys, urinary bladder have a normal
appearance.  There are no osseous lesions.
IMPRESSION: Sigmoid diverticulosis without radiographic evidence of
diverticulitis.

## 2014-06-26 ENCOUNTER — Ambulatory Visit (INDEPENDENT_AMBULATORY_CARE_PROVIDER_SITE_OTHER): Payer: BLUE CROSS/BLUE SHIELD | Admitting: Family Medicine

## 2014-06-26 ENCOUNTER — Encounter: Payer: Self-pay | Admitting: Family Medicine

## 2014-06-26 VITALS — BP 126/70 | HR 88 | Temp 97.3°F | Ht 76.0 in | Wt 286.6 lb

## 2014-06-26 DIAGNOSIS — Z5181 Encounter for therapeutic drug level monitoring: Secondary | ICD-10-CM | POA: Diagnosis not present

## 2014-06-26 DIAGNOSIS — E7800 Pure hypercholesterolemia, unspecified: Secondary | ICD-10-CM

## 2014-06-26 DIAGNOSIS — R350 Frequency of micturition: Secondary | ICD-10-CM | POA: Diagnosis not present

## 2014-06-26 DIAGNOSIS — N410 Acute prostatitis: Secondary | ICD-10-CM

## 2014-06-26 LAB — POCT URINALYSIS DIPSTICK
BILIRUBIN UA: NEGATIVE
GLUCOSE UA: NEGATIVE
KETONES UA: NEGATIVE
Leukocytes, UA: NEGATIVE
Nitrite, UA: NEGATIVE
Protein, UA: NEGATIVE
RBC UA: NEGATIVE
SPEC GRAV UA: 1.01
UROBILINOGEN UA: NEGATIVE
pH, UA: 6

## 2014-06-26 MED ORDER — ATORVASTATIN CALCIUM 20 MG PO TABS: 20.0000 mg | ORAL_TABLET | Freq: Every day | ORAL | Status: DC

## 2014-06-26 MED ORDER — CIPROFLOXACIN HCL 500 MG PO TABS: 500.0000 mg | ORAL_TABLET | Freq: Two times a day (BID) | ORAL | Status: DC

## 2014-06-26 NOTE — Patient Instructions (Addendum)
Limit caffeine and alcohol. Start cipro for presumptive treatment of prostatitis.  If your urinary symptoms do not resolve with treatment, we will refer you to [facility].  Restart lipitor. Return in 2-3 months for fasting labs, and then come for a physical.  Prostatitis The prostate gland is about the size and shape of a walnut. It is located just below your bladder. It produces one of the components of semen, which is made up of sperm and the fluids that help nourish and transport it out from the testicles. Prostatitis is inflammation of the prostate gland.  There are four types of prostatitis:  Acute bacterial prostatitis. This is the least common type of prostatitis. It starts quickly and usually is associated with a bladder infection, high fever, and shaking chills. It can occur at any age.  Chronic bacterial prostatitis. This is a persistent bacterial infection in the prostate. It usually develops from repeated acute bacterial prostatitis or acute bacterial prostatitis that was not properly treated. It can occur in men of any age but is most common in middle-aged men whose prostate has begun to enlarge. The symptoms are not as severe as those in acute bacterial prostatitis. Discomfort in the part of your body that is in front of your rectum and below your scrotum (perineum), lower abdomen, or in the head of your penis (glans) may represent your primary discomfort.  Chronic prostatitis (nonbacterial). This is the most common type of prostatitis. It is inflammation of the prostate gland that is not caused by a bacterial infection. The cause is unknown and may be associated with a viral infection or autoimmune disorder.  Prostatodynia (pelvic floor disorder). This is associated with increased muscular tone in the pelvis surrounding the prostate. CAUSES The causes of bacterial prostatitis are bacterial infection. The causes of the other types of prostatitis are unknown.  SYMPTOMS   Symptoms can vary depending upon the type of prostatitis that exists. There can also be overlap in symptoms. Possible symptoms for each type of prostatitis are listed below. Acute Bacterial Prostatitis  Painful urination.  Fever or chills.  Muscle or joint pains.  Low back pain.  Low abdominal pain.  Inability to empty bladder completely. Chronic Bacterial Prostatitis, Chronic Nonbacterial Prostatitis, and Prostatodynia  Sudden urge to urinate.  Frequent urination.  Difficulty starting urine stream.  Weak urine stream.  Discharge from the urethra.  Dribbling after urination.  Rectal pain.  Pain in the testicles, penis, or tip of the penis.  Pain in the perineum.  Problems with sexual function.  Painful ejaculation.  Bloody semen. DIAGNOSIS  In order to diagnose prostatitis, your health care provider will ask about your symptoms. One or more urine samples will be taken and tested (urinalysis). If the urinalysis result is negative for bacteria, your health care provider may use a finger to feel your prostate (digital rectal exam). This exam helps your health care provider determine if your prostate is swollen and tender. It will also produce a specimen of semen that can be analyzed. TREATMENT  Treatment for prostatitis depends on the cause. If a bacterial infection is the cause, it can be treated with antibiotic medicine. In cases of chronic bacterial prostatitis, the use of antibiotics for up to 1 month or 6 weeks may be necessary. Your health care provider may instruct you to take sitz baths to help relieve pain. A sitz bath is a bath of hot water in which your hips and buttocks are under water. This relaxes the pelvic floor  muscles and often helps to relieve the pressure on your prostate. HOME CARE INSTRUCTIONS   Take all medicines as directed by your health care provider.  Take sitz baths as directed by your health care provider. SEEK MEDICAL CARE IF:   Your  symptoms get worse, not better.  You have a fever. SEEK IMMEDIATE MEDICAL CARE IF:   You have chills.  You feel nauseous or vomit.  You feel lightheaded or faint.  You are unable to urinate.  You have blood or blood clots in your urine. MAKE SURE YOU:  Understand these instructions.  Will watch your condition.  Will get help right away if you are not doing well or get worse. Document Released: 03/24/2000 Document Revised: 04/01/2013 Document Reviewed: 10/14/2012 [facility] Patient Information 2015 [place], [place]. This information is not intended to replace advice given to you by your health care provider. Make sure you discuss any questions you have with your health care provider.   Fat and Cholesterol Control Diet Fat and cholesterol levels in your blood and organs are influenced by your diet. High levels of fat and cholesterol may lead to diseases of the heart, small and large blood vessels, gallbladder, liver, and pancreas. CONTROLLING FAT AND CHOLESTEROL WITH DIET Although exercise and lifestyle factors are important, your diet is key. That is because certain foods are known to raise cholesterol and others to lower it. The goal is to balance foods for their effect on cholesterol and more importantly, to replace saturated and trans fat with other types of fat, such as monounsaturated fat, polyunsaturated fat, and omega-3 fatty acids. On average, a person should consume no more than 15 to 17 g of saturated fat daily. Saturated and trans fats are considered "bad" fats, and they will raise LDL cholesterol. Saturated fats are primarily found in animal products such as meats, butter, and cream. However, that does not mean you need to give up all your favorite foods. Today, there are good tasting, low-fat, low-cholesterol substitutes for most of the things you like to eat. Choose low-fat or nonfat alternatives. Choose round or loin cuts of red meat. These types of cuts are lowest in fat and  cholesterol. Chicken (without the skin), fish, veal, and ground [place] breast are great choices. Eliminate fatty meats, such as hot dogs and salami. Even shellfish have little or no saturated fat. Have a 3 oz (85 g) portion when you eat lean meat, poultry, or fish. Trans fats are also called "partially hydrogenated oils." They are oils that have been scientifically manipulated so that they are solid at room temperature resulting in a longer shelf life and improved taste and texture of foods in which they are added. Trans fats are found in stick margarine, some tub margarines, cookies, crackers, and baked goods.  When baking and cooking, oils are a great substitute for butter. The monounsaturated oils are especially beneficial since it is believed they lower LDL and raise HDL. The oils you should avoid entirely are saturated tropical oils, such as coconut and palm.  Remember to eat a lot from food groups that are naturally free of saturated and trans fat, including fish, fruit, vegetables, beans, grains (barley, rice, couscous, bulgur wheat), and pasta (without cream sauces).  IDENTIFYING FOODS THAT LOWER FAT AND CHOLESTEROL  Soluble fiber may lower your cholesterol. This type of fiber is found in fruits such as apples, vegetables such as broccoli, potatoes, and carrots, legumes such as beans, peas, and lentils, and grains such as barley. Foods fortified  with plant sterols (phytosterol) may also lower cholesterol. You should eat at least 2 g per day of these foods for a cholesterol lowering effect.  Read package labels to identify low-saturated fats, trans fat free, and low-fat foods at the supermarket. Select cheeses that have only 2 to 3 g saturated fat per ounce. Use a heart-healthy tub margarine that is free of trans fats or partially hydrogenated oil. When buying baked goods (cookies, crackers), avoid partially hydrogenated oils. Breads and muffins should be made from whole grains (whole-wheat or whole oat  flour, instead of "flour" or "enriched flour"). Buy non-creamy canned soups with reduced salt and no added fats.  FOOD PREPARATION TECHNIQUES  Never deep-fry. If you must fry, either stir-fry, which uses very little fat, or use non-stick cooking sprays. When possible, broil, bake, or roast meats, and steam vegetables. Instead of putting butter or margarine on vegetables, use lemon and herbs, applesauce, and cinnamon (for squash and sweet potatoes). Use nonfat yogurt, salsa, and low-fat dressings for salads.  LOW-SATURATED FAT / LOW-FAT FOOD SUBSTITUTES Meats / Saturated Fat (g)  Avoid: Steak, marbled (3 oz/85 g) / 11 g  Choose: Steak, lean (3 oz/85 g) / 4 g  Avoid: Hamburger (3 oz/85 g) / 7 g  Choose: Hamburger, lean (3 oz/85 g) / 5 g  Avoid: Ham (3 oz/85 g) / 6 g  Choose: Ham, lean cut (3 oz/85 g) / 2.4 g  Avoid: Chicken, with skin, dark meat (3 oz/85 g) / 4 g  Choose: Chicken, skin removed, dark meat (3 oz/85 g) / 2 g  Avoid: Chicken, with skin, light meat (3 oz/85 g) / 2.5 g  Choose: Chicken, skin removed, light meat (3 oz/85 g) / 1 g Dairy / Saturated Fat (g)  Avoid: Whole milk (1 cup) / 5 g  Choose: Low-fat milk, 2% (1 cup) / 3 g  Choose: Low-fat milk, 1% (1 cup) / 1.5 g  Choose: Skim milk (1 cup) / 0.3 g  Avoid: Hard cheese (1 oz/28 g) / 6 g  Choose: Skim milk cheese (1 oz/28 g) / 2 to 3 g  Avoid: Cottage cheese, 4% fat (1 cup) / 6.5 g  Choose: Low-fat cottage cheese, 1% fat (1 cup) / 1.5 g  Avoid: Ice cream (1 cup) / 9 g  Choose: Sherbet (1 cup) / 2.5 g  Choose: Nonfat frozen yogurt (1 cup) / 0.3 g  Choose: Frozen fruit bar / trace  Avoid: Whipped cream (1 tbs) / 3.5 g  Choose: Nondairy whipped topping (1 tbs) / 1 g Condiments / Saturated Fat (g)  Avoid: Mayonnaise (1 tbs) / 2 g  Choose: Low-fat mayonnaise (1 tbs) / 1 g  Avoid: Butter (1 tbs) / 7 g  Choose: Extra light margarine (1 tbs) / 1 g  Avoid: Coconut oil (1 tbs) / 11.8 g  Choose: Olive  oil (1 tbs) / 1.8 g  Choose: Corn oil (1 tbs) / 1.7 g  Choose: Safflower oil (1 tbs) / 1.2 g  Choose: Sunflower oil (1 tbs) / 1.4 g  Choose: Soybean oil (1 tbs) / 2.4 g  Choose: Canola oil (1 tbs) / 1 g Document Released: 03/27/2005 Document Revised: 07/22/2012 Document Reviewed: 06/25/2013 [employer] Patient Information 2015 [place], [place]. This information is not intended to replace advice given to you by your health care provider. Make sure you discuss any questions you have with your health care provider.

## 2014-06-26 NOTE — Progress Notes (Signed)
Chief Complaint  Patient presents with  . Urinary Frequency    and some burning x 10 days.    He has been having urinary frequency, and feels like he still needs to void more after he finishes.  He sometimes feels like something is still there, in the urethra.  There is slight hesitation in starting urine stream, but then the stream is strong.  Denies weak/dribbly stream.  Feels like he doesn't completely empty, has to wait a while afterwards to see if he can go more.  Lately he still feels like there is more urine after voiding, and sometimes he does go again 20-30 mins later. These symptoms just started 10 days ago.  Has also had some burning--after he has finished voiding, sometimes during void, and sometimes not at all. Burning is sporadic, mild. He denies any penile discharge.  He is in a monogamous relationship with no concerns for STD's.  Caffeine--none yesterday or today, other times will have a lot of Coke in a day. Just started back on allergy meds, no decongestants.  Doesn't usually drink much alcohol (but had some beer with his brother while watching basketball at lunch today).  Hyperlipidemia: He hasn't been seen in a few years, and is no longer taking his Lipitor.  He would like to get back on it. He cut back on eggs and ice cream since his last visit.  +blue cheese and +bacon. He was put on 20mg  of lipitor in 09/2011.  Repeat labs in October 2013 were improved, but chol/HDL ratio was still high due to low HDL.  Was recommended to start fish oil.  Never followed up.  Didn't have any issue with tolerability of the lipitor.  PMH, PSH, SH and FH were all reviewed and updated today.  No current outpatient prescriptions on file prior to visit.   No current facility-administered medications on file prior to visit.   No Known Allergies  ROS: No fevers, chills.  No penile discharge, lesions. No nausea, vomiting, diarrhea, bleeding, bruising, rashes. +allergies (sneezing), claritin has helped  some.  No cough, shortness of breath, wheezing, chest pain, palpitations. No headaches, dizziness, other neuro complaints. No edema. See HPI  PHYSICAL EXAM: BP 126/70 mmHg  Pulse 88  Temp(Src) 97.3 F (36.3 C) (Tympanic)  Ht 6\' 4"  (1.93 m)  Wt 286 lb 9.6 oz (130.001 kg)  BMI 34.90 kg/m2  Well developed, pleasant, overweight male in no distress Neck: no lymphadenopathy, thyromegaly or mass Heart: regular rate and rhythm without murmur Lungs: clear bilaterally Back: no spinal or CVA tenderness Abdomen: soft, nontender, no mass GU: circumsized penis, no lesions or discharge Rectal exam: normal sphincter tone, no mass. Prostate mild-mod enlarged for his age, slightly boggy, nontender   ASSESSMENT/PLAN:  Urinary frequency - Plan: POCT Urinalysis Dipstick  Pure hypercholesterolemia - restart Lipitor.  reviewed proper diet. return for labs in 3 mos (and f/u at CPE, sooner if needed) - Plan: atorvastatin (LIPITOR) 20 MG tablet  Acute prostatitis - suspected.  cannot r/o urethral etiology of symptoms (ie stricture). if symptoms persist, will need to see urologist - Plan: ciprofloxacin (CIPRO) 500 MG tablet   Will treat for prostatitis with cipro x 2 weeks.   Advised to limit caffeine, alcohol  Restart Lipitor.  Reviewed low cholesterol diet again.  Handout given. Start fish oil  Schedule CPE with labs prior.

## 2014-09-28 ENCOUNTER — Other Ambulatory Visit: Payer: BLUE CROSS/BLUE SHIELD

## 2014-09-28 DIAGNOSIS — E7800 Pure hypercholesterolemia, unspecified: Secondary | ICD-10-CM

## 2014-09-28 DIAGNOSIS — Z5181 Encounter for therapeutic drug level monitoring: Secondary | ICD-10-CM

## 2014-09-28 DIAGNOSIS — N410 Acute prostatitis: Secondary | ICD-10-CM

## 2014-09-28 LAB — COMPREHENSIVE METABOLIC PANEL
ALK PHOS: 68 U/L (ref 39–117)
ALT: 60 U/L — AB (ref 0–53)
AST: 27 U/L (ref 0–37)
Albumin: 4.4 g/dL (ref 3.5–5.2)
BILIRUBIN TOTAL: 0.7 mg/dL (ref 0.2–1.2)
BUN: 13 mg/dL (ref 6–23)
CO2: 22 mEq/L (ref 19–32)
CREATININE: 1.04 mg/dL (ref 0.50–1.35)
Calcium: 9.2 mg/dL (ref 8.4–10.5)
Chloride: 106 mEq/L (ref 96–112)
Glucose, Bld: 96 mg/dL (ref 70–99)
Potassium: 4.3 mEq/L (ref 3.5–5.3)
SODIUM: 142 meq/L (ref 135–145)
TOTAL PROTEIN: 6.9 g/dL (ref 6.0–8.3)

## 2014-09-28 LAB — CBC WITH DIFFERENTIAL/PLATELET
BASOS ABS: 0.1 10*3/uL (ref 0.0–0.1)
BASOS PCT: 1 % (ref 0–1)
EOS PCT: 3 % (ref 0–5)
Eosinophils Absolute: 0.2 10*3/uL (ref 0.0–0.7)
HCT: 42.6 % (ref 39.0–52.0)
Hemoglobin: 14.5 g/dL (ref 13.0–17.0)
Lymphocytes Relative: 32 % (ref 12–46)
Lymphs Abs: 1.6 10*3/uL (ref 0.7–4.0)
MCH: 28.8 pg (ref 26.0–34.0)
MCHC: 34 g/dL (ref 30.0–36.0)
MCV: 84.7 fL (ref 78.0–100.0)
MONO ABS: 0.4 10*3/uL (ref 0.1–1.0)
MPV: 9 fL (ref 8.6–12.4)
Monocytes Relative: 8 % (ref 3–12)
NEUTROS ABS: 2.8 10*3/uL (ref 1.7–7.7)
Neutrophils Relative %: 56 % (ref 43–77)
PLATELETS: 227 10*3/uL (ref 150–400)
RBC: 5.03 MIL/uL (ref 4.22–5.81)
RDW: 13.5 % (ref 11.5–15.5)
WBC: 5 10*3/uL (ref 4.0–10.5)

## 2014-09-28 LAB — TSH: TSH: 1.816 u[IU]/mL (ref 0.350–4.500)

## 2014-09-28 LAB — LIPID PANEL
Cholesterol: 169 mg/dL (ref 0–200)
HDL: 44 mg/dL (ref >=40)
LDL Cholesterol: 110 mg/dL — ABNORMAL HIGH (ref 0–99)
TRIGLYCERIDES: 76 mg/dL (ref <150)
Total CHOL/HDL Ratio: 3.8 Ratio
VLDL: 15 mg/dL (ref 0–40)

## 2014-10-07 ENCOUNTER — Encounter: Payer: Self-pay | Admitting: Family Medicine

## 2014-10-07 ENCOUNTER — Ambulatory Visit (INDEPENDENT_AMBULATORY_CARE_PROVIDER_SITE_OTHER): Payer: BLUE CROSS/BLUE SHIELD | Admitting: Family Medicine

## 2014-10-07 VITALS — BP 108/70 | HR 84 | Ht 77.0 in | Wt 267.8 lb

## 2014-10-07 DIAGNOSIS — K644 Residual hemorrhoidal skin tags: Secondary | ICD-10-CM | POA: Insufficient documentation

## 2014-10-07 DIAGNOSIS — Z0000 Encounter for general adult medical examination without abnormal findings: Secondary | ICD-10-CM

## 2014-10-07 DIAGNOSIS — E7800 Pure hypercholesterolemia, unspecified: Secondary | ICD-10-CM

## 2014-10-07 DIAGNOSIS — K648 Other hemorrhoids: Secondary | ICD-10-CM

## 2014-10-07 DIAGNOSIS — Z23 Encounter for immunization: Secondary | ICD-10-CM | POA: Diagnosis not present

## 2014-10-07 DIAGNOSIS — R7989 Other specified abnormal findings of blood chemistry: Secondary | ICD-10-CM

## 2014-10-07 DIAGNOSIS — R945 Abnormal results of liver function studies: Secondary | ICD-10-CM

## 2014-10-07 LAB — POCT URINALYSIS DIPSTICK
Bilirubin, UA: NEGATIVE
Glucose, UA: NEGATIVE
Ketones, UA: NEGATIVE
Leukocytes, UA: NEGATIVE
NITRITE UA: NEGATIVE
Protein, UA: NEGATIVE
RBC UA: NEGATIVE
Spec Grav, UA: 1.025
Urobilinogen, UA: NEGATIVE
pH, UA: 6

## 2014-10-07 MED ORDER — ATORVASTATIN CALCIUM 20 MG PO TABS: 20.0000 mg | ORAL_TABLET | Freq: Every day | ORAL | Status: DC

## 2014-10-07 NOTE — Progress Notes (Signed)
Chief Complaint  Patient presents with  . Annual Exam    fasting (had fiber bar this am 9:15am) annual exam. Only concern is having slight hemorrhoid flare,esp when driving.    Scott Hensley is a 43 y.o. male who presents for a complete physical.  He has the following concerns:  Seen in March with complaint of urinary frequency.  He was treated with cipro x 2 weeks for prostatitis.  Symptoms have resolved.  Hyperlipidemia:  He restarted lipitor in March.  He reportedly remembers to take it about 80% of the time.  He moved the bottle upstairs, and in the last few weeks has been better about remembering to take it daily.  Denies any side effects.  Occasionally gets a little sharp stinging sensation (like a "firework") in boths calves, especially in the lower leg, if legs are crossed. Occurs when seated at work, no other time.  He had bloodwork prior to his visit today. He recently got last refill of lipitor (should have completed all three months by 6/18)  Last week he had to drive to [facility] twice for work--flared up his hemorrhoids.  He hasn't had any bleeding in a while, but intermittently does bleed.  He describes a pressure/discomfort after having a bowel movement, like he still needs to go.  Since eating a high fiber diet, he hasn't had any regular constipation (only rarely).  He describes a pressure and discomfort while he is sitting and driving long distances. He is asking about what kind of treatments he can use    There is no immunization history on file for this patient. Last colonoscopy: 10/2010 Dr. Randa Evens; diverticulosis noted Last PSA: never Dentist: twice a year Ophtho: never Exercise:  Using kettlebell in the mornings. No other regular cardio.  Past Medical History  Diagnosis Date  . Diverticulosis 10/2010    Dr. Randa Evens, seen on CT  . IBS (irritable bowel syndrome)     Dr. Randa Evens  . Hyperlipidemia     Past Surgical History  Procedure Laterality Date  . Colonoscopy   10/2010    Dr. Randa Evens  . Esophagogastroduodenoscopy  12/03/07    Dr.Edwards-Small hiatal hernia. Mod-severe esophagitis  . Nasal septum surgery  2007    History   Social History  . Marital Status: Married    Spouse Name: N/A  . Number of Children: 4  . Years of Education: N/A   Occupational History  . attorney    Social History Main Topics  . Smoking status: Never Smoker   . Smokeless tobacco: Never Used  . Alcohol Use: 0.0 oz/week    0 Standard drinks or equivalent per week     Comment: averages about 3 drinks/week (some weeks none, occasionally 6/weekend)  . Drug Use: No  . Sexual Activity:    Partners: Female   Other Topics Concern  . Not on file   Social History Narrative   Lives with wife, 4 kids, [place] Terrier and a Publishing copy    Family History  Problem Relation Age of Onset  . Cancer Mother     skin  . Colon polyps Mother   . Thyroid disease Mother     ?nodule (in 76's)  . Diabetes Father   . Hyperlipidemia Father   . Heart disease Father     CABG at 47  . Urolithiasis Father   . Ulcers Father   . Cancer Father     skin  . Hyperlipidemia Brother   . Cancer Maternal Grandmother  colon cancer (mid 80's)  . Diabetes Paternal Uncle     Outpatient Encounter Prescriptions as of 10/07/2014  Medication Sig Note  . atorvastatin (LIPITOR) 20 MG tablet Take 1 tablet (20 mg total) by mouth daily.   . [DISCONTINUED] atorvastatin (LIPITOR) 20 MG tablet Take 1 tablet (20 mg total) by mouth daily. 10/07/2014: Takes about 80% of the time.  Marland Kitchen. loratadine (CLARITIN) 10 MG tablet Take 10 mg by mouth daily.   . [DISCONTINUED] ciprofloxacin (CIPRO) 500 MG tablet Take 1 tablet (500 mg total) by mouth 2 (two) times daily.    No facility-administered encounter medications on file as of 10/07/2014.    No Known Allergies   ROS:  The patient denies anorexia, fever, headaches,  vision loss, decreased hearing, ear pain, hoarseness, chest pain, palpitations, dizziness,  syncope, dyspnea on exertion, cough, swelling, nausea, vomiting, diarrhea, constipation, abdominal pain, melena, hematochezia, indigestion/heartburn, hematuria, incontinence, erectile dysfunction, nocturia, weakened urine stream, dysuria, genital lesions, joint pains, numbness, tingling, weakness, tremor, suspicious skin lesions, depression, anxiety, abnormal bleeding/bruising, or enlarged lymph nodes Some numbness to his right lateral thigh/hip; has had for a long time, no change. +intentional weight loss (with improving his diet). Cyst on his left shoulder--sometimes when pressed on, it drains an odorous discharge.  Denies pain, fever or significant change  PHYSICAL EXAM:   BP 108/70 mmHg  Pulse 84  Ht 6\' 5"  (1.956 m)  Wt 267 lb 12.8 oz (121.473 kg)  BMI 31.75 kg/m2  General Appearance:    Alert, cooperative, no distress, appears stated age  Head:    Normocephalic, without obvious abnormality, atraumatic  Eyes:    PERRL, conjunctiva/corneas clear, EOM's intact, fundi    benign  Ears:    Normal TM's and external ear canals  Nose:   Nares normal, mucosa normal, no drainage or sinus   tenderness  Throat:   Lips, mucosa, and tongue normal; teeth and gums normal  Neck:   Supple, no lymphadenopathy;  thyroid:  no   enlargement/tenderness/nodules; no carotid   bruit or JVD  Back:    Spine nontender, no curvature, ROM normal, no CVA     tenderness  Lungs:     Clear to auscultation bilaterally without wheezes, rales or     ronchi; respirations unlabored  Chest Wall:    No tenderness or deformity   Heart:    Regular rate and rhythm, S1 and S2 normal, no murmur, rub   or gallop  Breast Exam:    No chest wall tenderness, masses or gynecomastia  Abdomen:     Soft, non-tender, nondistended, normoactive bowel sounds,    no masses, no hepatosplenomegaly  Genitalia:    Normal male external genitalia without lesions.  Testicles without masses.  No inguinal hernias.  Rectal:    Normal sphincter tone,  no masses or tenderness; guaiac negative stool. External hemorrhoids, mildly inflamed, not bleeding. Prostate smooth, mildly enlarged, nontender, no nodules.  Extremities:   No clubbing, cyanosis or edema  Pulses:   2+ and symmetric all extremities  Skin:   Skin color, texture, turgor normal, no rashes. Large, nontender sebaceous cyst on posterior left shoulder; no drainage  Lymph nodes:   Cervical, supraclavicular, and axillary nodes normal  Neurologic:   CNII-XII intact, normal strength, sensation and gait; reflexes 2+ and symmetric throughout          Psych:   Normal mood, affect, hygiene and grooming.    Lab Results  Component Value Date   WBC 5.0 09/28/2014  HGB 14.5 09/28/2014   HCT 42.6 09/28/2014   MCV 84.7 09/28/2014   PLT 227 09/28/2014   Lab Results  Component Value Date   CHOL 169 09/28/2014   HDL 44 09/28/2014   LDLCALC 110* 09/28/2014   TRIG 76 09/28/2014   CHOLHDL 3.8 09/28/2014     Chemistry      Component Value Date/Time   NA 142 09/28/2014 0001   K 4.3 09/28/2014 0001   CL 106 09/28/2014 0001   CO2 22 09/28/2014 0001   BUN 13 09/28/2014 0001   CREATININE 1.04 09/28/2014 0001      Component Value Date/Time   CALCIUM 9.2 09/28/2014 0001   ALKPHOS 68 09/28/2014 0001   AST 27 09/28/2014 0001   ALT 60* 09/28/2014 0001   BILITOT 0.7 09/28/2014 0001     Fasting glucose 96  Lab Results  Component Value Date   TSH 1.816 09/28/2014   Normal urine dip today  ASSESSMENT/PLAN:  Annual physical exam - Plan: POCT Urinalysis Dipstick, Visual acuity screening  Immunization due - Plan: Tdap vaccine greater than or equal to 7yo IM  Pure hypercholesterolemia - controlled with Lipitor. Continue, along with lowfat, low cholesterol diet - Plan: atorvastatin (LIPITOR) 20 MG tablet, Lipid panel, Hepatic function panel  External hemorrhoid - OTC anusol HC prn  Elevated LFTs - minimally elevated. Likely related to ETOH use the weekend before testing, rather than  from meds or other etiology. Recheck in 6 mos. limit ETOH, risks reviewed   Liver test was slightly elevated--likely related to excess alcohol over Father's Day weekend prior to his blood tests. Discussed healthy alcohol amounts.   Lipids--well controlled.  Return for recheck in 6 months.  Multivitamin recommended.  Discussed PSA screening (risks/benefits) briefly--age 54, recommended at least 30 minutes of aerobic activity at least 5 days/week; proper sunscreen use reviewed; healthy diet and alcohol recommendations (less than or equal to 2 drinks/day) reviewed; regular seatbelt use; changing batteries in smoke detectors. Self-testicular exams. Immunization recommendations discussed--Tdap today; yearly flu shots are recommended.  Colonoscopy recommendations reviewed, age 44   F/u 6 months with LFT/lipids prior (ultimately, if LFT's and lipids remain normal, can be checked once yearly, but plan recheck in 6 months this time)

## 2014-10-07 NOTE — Patient Instructions (Addendum)
HEALTH MAINTENANCE RECOMMENDATIONS:  It is recommended that you get at least 30 minutes of aerobic exercise at least 5 days/week (for weight loss, you may need as much as 60-90 minutes). This can be any activity that gets your heart rate up. This can be divided in 10-15 minute intervals if needed, but try and build up your endurance at least once a week.  Weight bearing exercise is also recommended twice weekly.  Eat a healthy diet with lots of vegetables, fruits and fiber.  "Colorful" foods have a lot of vitamins (ie green vegetables, tomatoes, red peppers, etc).  Limit sweet tea, regular sodas and alcoholic beverages, all of which has a lot of calories and sugar.  Up to 2 alcoholic drinks daily may be beneficial for men (unless trying to lose weight, watch sugars).  Drink a lot of water.  Sunscreen of at least SPF 30 should be used on all sun-exposed parts of the skin when outside between the hours of 10 am and 4 pm (not just when at beach or pool, but even with exercise, golf, tennis, and yard work!)  Use a sunscreen that says "broad spectrum" so it covers both UVA and UVB rays, and make sure to reapply every 1-2 hours.  Remember to change the batteries in your smoke detectors when changing your clock times in the spring and fall.  Use your seat belt every time you are in a car, and please drive safely and not be distracted with cell phones and texting while driving.  Routine eye exam is recommended. Consider taking a multivitamin daily (with vitamin D)  Lab Results  Component Value Date   CHOL 169 09/28/2014   HDL 44 09/28/2014   LDLCALC 110* 09/28/2014   TRIG 76 09/28/2014   CHOLHDL 3.8 09/28/2014     Chemistry      Component Value Date/Time   NA 142 09/28/2014 0001   K 4.3 09/28/2014 0001   CL 106 09/28/2014 0001   CO2 22 09/28/2014 0001   BUN 13 09/28/2014 0001   CREATININE 1.04 09/28/2014 0001      Component Value Date/Time   CALCIUM 9.2 09/28/2014 0001   ALKPHOS 68  09/28/2014 0001   AST 27 09/28/2014 0001   ALT 60* 09/28/2014 0001   BILITOT 0.7 09/28/2014 0001     (normal ALT is up to 53)  Fasting glucose 96  Lab Results  Component Value Date   TSH 1.816 09/28/2014   Lab Results  Component Value Date   WBC 5.0 09/28/2014   HGB 14.5 09/28/2014   HCT 42.6 09/28/2014   MCV 84.7 09/28/2014   PLT 227 09/28/2014   Hemorrhoids Hemorrhoids are swollen veins around the rectum or anus. There are two types of hemorrhoids:   Internal hemorrhoids. These occur in the veins just inside the rectum. They may poke through to the outside and become irritated and painful.  External hemorrhoids. These occur in the veins outside the anus and can be felt as a painful swelling or hard lump near the anus. CAUSES  Pregnancy.   Obesity.   Constipation or diarrhea.   Straining to have a bowel movement.   Sitting for long periods on the toilet.  Heavy lifting or other activity that caused you to strain.  Anal intercourse. SYMPTOMS   Pain.   Anal itching or irritation.   Rectal bleeding.   Fecal leakage.   Anal swelling.   One or more lumps around the anus.  DIAGNOSIS  Your caregiver may  be able to diagnose hemorrhoids by visual examination. Other examinations or tests that may be performed include:   Examination of the rectal area with a gloved hand (digital rectal exam).   Examination of anal canal using a small tube (scope).   A blood test if you have lost a significant amount of blood.  A test to look inside the colon (sigmoidoscopy or colonoscopy). TREATMENT Most hemorrhoids can be treated at home. However, if symptoms do not seem to be getting better or if you have a lot of rectal bleeding, your caregiver may perform a procedure to help make the hemorrhoids get smaller or remove them completely. Possible treatments include:   Placing a rubber band at the base of the hemorrhoid to cut off the circulation (rubber band  ligation).   Injecting a chemical to shrink the hemorrhoid (sclerotherapy).   Using a tool to burn the hemorrhoid (infrared light therapy).   Surgically removing the hemorrhoid (hemorrhoidectomy).   Stapling the hemorrhoid to block blood flow to the tissue (hemorrhoid stapling).  HOME CARE INSTRUCTIONS   Eat foods with fiber, such as whole grains, beans, nuts, fruits, and vegetables. Ask your doctor about taking products with added fiber in them (fibersupplements).  Increase fluid intake. Drink enough water and fluids to keep your urine clear or pale yellow.   Exercise regularly.   Go to the bathroom when you have the urge to have a bowel movement. Do not wait.   Avoid straining to have bowel movements.   Keep the anal area dry and clean. Use wet toilet paper or moist towelettes after a bowel movement.   Medicated creams and suppositories may be used or applied as directed.   Only take over-the-counter or prescription medicines as directed by your caregiver.   Take warm sitz baths for 15-20 minutes, 3-4 times a day to ease pain and discomfort.   Place ice packs on the hemorrhoids if they are tender and swollen. Using ice packs between sitz baths may be helpful.   Put ice in a plastic bag.   Place a towel between your skin and the bag.   Leave the ice on for 15-20 minutes, 3-4 times a day.   Do not use a donut-shaped pillow or sit on the toilet for long periods. This increases blood pooling and pain.  SEEK MEDICAL CARE IF:  You have increasing pain and swelling that is not controlled by treatment or medicine.  You have uncontrolled bleeding.  You have difficulty or you are unable to have a bowel movement.  You have pain or inflammation outside the area of the hemorrhoids. MAKE SURE YOU:  Understand these instructions.  Will watch your condition.  Will get help right away if you are not doing well or get worse. Document Released: 03/24/2000  Document Revised: 03/13/2012 Document Reviewed: 01/30/2012 [facility] Patient Information 2015 [place], [place]. This information is not intended to replace advice given to you by your health care provider. Make sure you discuss any questions you have with your health care provider.

## 2015-04-19 ENCOUNTER — Other Ambulatory Visit: Payer: BLUE CROSS/BLUE SHIELD

## 2015-04-21 ENCOUNTER — Encounter: Payer: BLUE CROSS/BLUE SHIELD | Admitting: Family Medicine

## 2015-04-21 ENCOUNTER — Telehealth: Payer: Self-pay | Admitting: *Deleted

## 2015-04-21 NOTE — Telephone Encounter (Signed)
It appears that he no-showed his labs on 1/9--had we recognized that earlier, we could have called and prevented this missed appointment.  He needs to reschedule, and reschedule labs as well. He needs to be notified of no show policy (snow days may have messed him up, has 4 kids who were out of school).

## 2015-04-21 NOTE — Telephone Encounter (Signed)
This patient no showed for their appointment today.Which of the following is necessary for this patient.   A) No follow-up necessary   B) Follow-up urgent. Locate Patient Immediately.   C) Follow-up necessary. Contact patient and Schedule visit in ____ Days.   D) Follow-up Advised. Contact patient and Schedule visit in ____ Days. 

## 2015-04-22 NOTE — Telephone Encounter (Signed)
Spoke with patient and he did miss due to snow days with kids, he states that he called here around 9:00am that morning to cancel but cannot remember name of person that he spoke to. He then said that he told them he would call back once he looked at his schedule. He told me today that he will call me back to schedule next week, but cannot come until Feb.

## 2015-04-23 ENCOUNTER — Encounter: Payer: Self-pay | Admitting: Family Medicine

## 2017-01-25 DIAGNOSIS — D1801 Hemangioma of skin and subcutaneous tissue: Secondary | ICD-10-CM | POA: Diagnosis not present

## 2017-01-25 DIAGNOSIS — L570 Actinic keratosis: Secondary | ICD-10-CM | POA: Diagnosis not present

## 2017-01-25 DIAGNOSIS — D2321 Other benign neoplasm of skin of right ear and external auricular canal: Secondary | ICD-10-CM | POA: Diagnosis not present

## 2017-01-25 DIAGNOSIS — L821 Other seborrheic keratosis: Secondary | ICD-10-CM | POA: Diagnosis not present

## 2017-01-25 DIAGNOSIS — D239 Other benign neoplasm of skin, unspecified: Secondary | ICD-10-CM | POA: Diagnosis not present

## 2017-01-25 DIAGNOSIS — L720 Epidermal cyst: Secondary | ICD-10-CM | POA: Diagnosis not present

## 2017-01-25 DIAGNOSIS — L738 Other specified follicular disorders: Secondary | ICD-10-CM | POA: Diagnosis not present

## 2017-03-12 DIAGNOSIS — J020 Streptococcal pharyngitis: Secondary | ICD-10-CM | POA: Diagnosis not present

## 2018-04-29 ENCOUNTER — Encounter: Payer: Self-pay | Admitting: Family Medicine

## 2018-04-29 ENCOUNTER — Ambulatory Visit (INDEPENDENT_AMBULATORY_CARE_PROVIDER_SITE_OTHER): Payer: BLUE CROSS/BLUE SHIELD | Admitting: Family Medicine

## 2018-04-29 VITALS — BP 120/80 | HR 64 | Ht 77.0 in | Wt 291.0 lb

## 2018-04-29 DIAGNOSIS — E559 Vitamin D deficiency, unspecified: Secondary | ICD-10-CM

## 2018-04-29 DIAGNOSIS — Z23 Encounter for immunization: Secondary | ICD-10-CM

## 2018-04-29 DIAGNOSIS — R635 Abnormal weight gain: Secondary | ICD-10-CM | POA: Diagnosis not present

## 2018-04-29 DIAGNOSIS — R7301 Impaired fasting glucose: Secondary | ICD-10-CM

## 2018-04-29 DIAGNOSIS — R5383 Other fatigue: Secondary | ICD-10-CM

## 2018-04-29 DIAGNOSIS — E7800 Pure hypercholesterolemia, unspecified: Secondary | ICD-10-CM

## 2018-04-29 DIAGNOSIS — R202 Paresthesia of skin: Secondary | ICD-10-CM

## 2018-04-29 NOTE — Patient Instructions (Signed)
  Be sure to stay well hydrated, drinking lots of water. Moisturize your hands at least 3 times daily (more if frequent hand-washing). Consider using Vaseline or ointment at bedtime. You may use hydrocortisone 2-3 times daily if needed for itching.  We will be in touch with your lab results in the next 1-2 days. Expect that we restart your atorvastatin.  Please work on healthy diet and weight loss.

## 2018-04-29 NOTE — Progress Notes (Signed)
Chief Complaint  Patient presents with  . Hand itching    started about 8 days ago with itching of the palms. Hand feels somewhat tight when he closes them. Hands are really dry. Also some loss of sensation. Describes it like "everything he touches feels like suede, soft like cat fur almost." Not really tingling or burning.    Itchy, dry and slightly numb palms bilatearlly. Started 8-9 days ago, just after he had a GI illness. (constipated, gassy, discomfort on left side, followed by a lot of diarrhea).  There is slight numbness of the palms only, on both hands.  Minimal tingling in the palms, intermittent. No weakness.  Things don't really feel the same, "like suede". No symptoms in his toes.  He reports they are slightly dry, feel a little tight.    Patient hasn't been here in a long time.  He hasn't taken atorvastatin in at least 2 years. He didn't have any tolerability issues, just ran out.   PMH, PSH, SH reviewed  Outpatient Encounter Medications as of 04/29/2018  Medication Sig  . atorvastatin (LIPITOR) 20 MG tablet Take 1 tablet (20 mg total) by mouth daily. (Patient not taking: Reported on 04/29/2018)  . [DISCONTINUED] loratadine (CLARITIN) 10 MG tablet Take 10 mg by mouth daily.   No facility-administered encounter medications on file as of 04/29/2018.    No Known Allergies  ROS: no fever, chills, headaches, syncope.  He has some rare short-lived vertigo. Denies URI symptoms, neck pain, numbness/tingling other than in his hands.  No further GI symptoms, resolved.  No urinary complaints.  No rash, other than the dryness of the hands.  PHYSICAL EXAM:  BP 120/80   Pulse 64   Ht _0  (1.956 m)   Wt 291 lb (132 kg)   BMI 34.51 kg/m   Wt Readings from Last 3 Encounters:  04/29/18 291 lb (132 kg)  10/07/14 267 lb 12.8 oz (121.5 kg)  06/26/14 286 lb 9.6 oz (130 kg)   Well appearing, pleasant male in no distress HEENT: conjunctiva and sclera are clear, anicteric. OP  clear Neck: no lymphadenopathy, thyromegaly or mass. No spinal tenderness Heart: regular rate and rhythm, no murmur Lungs: clear bilaterally Back: no spinal or CVA tenderness Abdomen: soft, nontender, no organomegaly or mass Extremities: no edema, normal pulses. Hands: Slightly dry noted along some of the palmar creases, and slightly thickened/hyperkeratotic only on the outer aspect of the right pinkie/palm. Otherwise appears normal--no tightness or other skin changes noted. Skin: elsewhere appears normal, no rashes, normal turgor.  ASSESSMENT/PLAN:  Paresthesia of both hands - also itching--suspect dry skin.  Will evaluate for underlying etiology (suspect low D) - Plan: Comprehensive metabolic panel, CBC with Differential/Platelet, TSH  Need for influenza vaccination - Plan: Flu Vaccine QUAD 6+ mos PF IM (Fluarix Quad PF)  Pure hypercholesterolemia - noncompliant with meds; likely will need to restart atorvastatin once labs back - Plan: Lipid panel  Impaired fasting glucose - very mild, noted in the past.  Hasn't been checked in years, has gained weight. screen for DM (he is fasting) - Plan: Comprehensive metabolic panel, Hemoglobin A1c  Weight gain - encouraged healthy diet, regular exercise and weight loss - Plan: TSH  Fatigue, unspecified type - Plan: Comprehensive metabolic panel, CBC with Differential/Platelet, TSH, VITAMIN D 25 Hydroxy (Vit-D Deficiency, Fractures)   c-met, lipid, TSH, CBC, A1c [facility] [address]  Scheduled CPE, f/u soon prn    Be sure to stay well hydrated, drinking lots  of water. Moisturize your hands at least 3 times daily (more if frequent hand-washing). Consider using Vaseline or ointment at bedtime. You may use hydrocortisone 2-3 times daily if needed for itching.  We will be in touch with your lab results in the next 1-2 days. Expect that we restart your atorvastatin.  Please work on healthy diet and weight loss.   ADDENDUM: Vitamin  D-OH low at 9.9, sent in rx and advised of need for daily supplement once Rx completed Restart atorvastatin Lab Results  Component Value Date   CHOL 270 (H) 04/29/2018   HDL 49 04/29/2018   LDLCALC 182 (H) 04/29/2018   TRIG 194 (H) 04/29/2018   CHOLHDL 5.5 (H) 04/29/2018

## 2018-04-30 ENCOUNTER — Encounter: Payer: Self-pay | Admitting: Family Medicine

## 2018-04-30 DIAGNOSIS — E559 Vitamin D deficiency, unspecified: Secondary | ICD-10-CM | POA: Insufficient documentation

## 2018-04-30 LAB — CBC WITH DIFFERENTIAL/PLATELET
BASOS ABS: 0.1 10*3/uL (ref 0.0–0.2)
Basos: 1 %
EOS (ABSOLUTE): 0.1 10*3/uL (ref 0.0–0.4)
Eos: 3 %
HEMATOCRIT: 45.8 % (ref 37.5–51.0)
Hemoglobin: 15.6 g/dL (ref 13.0–17.7)
Immature Grans (Abs): 0 10*3/uL (ref 0.0–0.1)
Immature Granulocytes: 0 %
LYMPHS ABS: 1.4 10*3/uL (ref 0.7–3.1)
Lymphs: 29 %
MCH: 29.3 pg (ref 26.6–33.0)
MCHC: 34.1 g/dL (ref 31.5–35.7)
MCV: 86 fL (ref 79–97)
MONOS ABS: 0.4 10*3/uL (ref 0.1–0.9)
Monocytes: 9 %
Neutrophils Absolute: 2.7 10*3/uL (ref 1.4–7.0)
Neutrophils: 58 %
Platelets: 269 10*3/uL (ref 150–450)
RBC: 5.32 x10E6/uL (ref 4.14–5.80)
RDW: 12.3 % (ref 11.6–15.4)
WBC: 4.8 10*3/uL (ref 3.4–10.8)

## 2018-04-30 LAB — COMPREHENSIVE METABOLIC PANEL
A/G RATIO: 1.8 (ref 1.2–2.2)
ALBUMIN: 4.7 g/dL (ref 4.0–5.0)
ALK PHOS: 68 IU/L (ref 39–117)
ALT: 56 IU/L — ABNORMAL HIGH (ref 0–44)
AST: 26 IU/L (ref 0–40)
BUN / CREAT RATIO: 15 (ref 9–20)
BUN: 19 mg/dL (ref 6–24)
Bilirubin Total: 0.5 mg/dL (ref 0.0–1.2)
CO2: 22 mmol/L (ref 20–29)
Calcium: 9.8 mg/dL (ref 8.7–10.2)
Chloride: 105 mmol/L (ref 96–106)
Creatinine, Ser: 1.26 mg/dL (ref 0.76–1.27)
GFR calc Af Amer: 79 mL/min/{1.73_m2} (ref >59)
GFR calc non Af Amer: 68 mL/min/{1.73_m2} (ref >59)
GLOBULIN, TOTAL: 2.6 g/dL (ref 1.5–4.5)
Glucose: 99 mg/dL (ref 65–99)
Potassium: 4.8 mmol/L (ref 3.5–5.2)
SODIUM: 143 mmol/L (ref 134–144)
Total Protein: 7.3 g/dL (ref 6.0–8.5)

## 2018-04-30 LAB — HEMOGLOBIN A1C
Est. average glucose Bld gHb Est-mCnc: 108 mg/dL
Hgb A1c MFr Bld: 5.4 % (ref 4.8–5.6)

## 2018-04-30 LAB — LIPID PANEL
CHOLESTEROL TOTAL: 270 mg/dL — AB (ref 100–199)
Chol/HDL Ratio: 5.5 ratio — ABNORMAL HIGH (ref 0.0–5.0)
HDL: 49 mg/dL (ref >39)
LDL CALC: 182 mg/dL — AB (ref 0–99)
TRIGLYCERIDES: 194 mg/dL — AB (ref 0–149)
VLDL CHOLESTEROL CAL: 39 mg/dL (ref 5–40)

## 2018-04-30 LAB — TSH: TSH: 1.97 u[IU]/mL (ref 0.450–4.500)

## 2018-04-30 LAB — VITAMIN D 25 HYDROXY (VIT D DEFICIENCY, FRACTURES): VIT D 25 HYDROXY: 9.9 ng/mL — AB (ref 30.0–100.0)

## 2018-04-30 MED ORDER — VITAMIN D (ERGOCALCIFEROL) 1.25 MG (50000 UNIT) PO CAPS: 50000.0000 [IU] | ORAL_CAPSULE | ORAL | 0 refills | Status: DC

## 2018-04-30 MED ORDER — ATORVASTATIN CALCIUM 20 MG PO TABS: 20.0000 mg | ORAL_TABLET | Freq: Every day | ORAL | 0 refills | Status: DC

## 2018-05-01 ENCOUNTER — Other Ambulatory Visit: Payer: Self-pay | Admitting: *Deleted

## 2018-05-01 DIAGNOSIS — E7800 Pure hypercholesterolemia, unspecified: Secondary | ICD-10-CM

## 2018-05-01 DIAGNOSIS — Z5181 Encounter for therapeutic drug level monitoring: Secondary | ICD-10-CM

## 2018-05-02 MED ORDER — TRIAMCINOLONE ACETONIDE 0.1 % EX CREA: TOPICAL_CREAM | CUTANEOUS | 1 refills | Status: DC

## 2018-05-09 DIAGNOSIS — L308 Other specified dermatitis: Secondary | ICD-10-CM | POA: Diagnosis not present

## 2018-05-09 DIAGNOSIS — L720 Epidermal cyst: Secondary | ICD-10-CM | POA: Diagnosis not present

## 2018-05-23 DIAGNOSIS — L720 Epidermal cyst: Secondary | ICD-10-CM | POA: Diagnosis not present

## 2018-05-28 DIAGNOSIS — R1012 Left upper quadrant pain: Secondary | ICD-10-CM | POA: Diagnosis not present

## 2018-05-28 DIAGNOSIS — R198 Other specified symptoms and signs involving the digestive system and abdomen: Secondary | ICD-10-CM | POA: Diagnosis not present

## 2018-05-30 ENCOUNTER — Other Ambulatory Visit: Payer: Self-pay | Admitting: Gastroenterology

## 2018-05-30 ENCOUNTER — Encounter: Payer: Self-pay | Admitting: Family Medicine

## 2018-05-30 DIAGNOSIS — R1012 Left upper quadrant pain: Secondary | ICD-10-CM

## 2018-06-10 ENCOUNTER — Other Ambulatory Visit: Payer: Self-pay | Admitting: Gastroenterology

## 2018-06-12 ENCOUNTER — Other Ambulatory Visit: Payer: Self-pay

## 2018-06-19 ENCOUNTER — Ambulatory Visit
Admission: RE | Admit: 2018-06-19 | Discharge: 2018-06-19 | Disposition: A | Discharge status: Home / Self Care | Payer: BLUE CROSS/BLUE SHIELD | Source: Ambulatory Visit | Attending: Gastroenterology | Admitting: Gastroenterology

## 2018-06-19 ENCOUNTER — Other Ambulatory Visit: Payer: Self-pay

## 2018-06-19 DIAGNOSIS — K5900 Constipation, unspecified: Secondary | ICD-10-CM | POA: Diagnosis not present

## 2018-06-19 DIAGNOSIS — R1012 Left upper quadrant pain: Secondary | ICD-10-CM | POA: Diagnosis not present

## 2018-06-19 MED ORDER — IOPAMIDOL (ISOVUE-300) INJECTION 61%
125.0000 mL | Freq: Once | INTRAVENOUS | Status: AC | PRN
  Administered 2018-06-19: 125 mL via INTRAVENOUS

## 2018-06-25 DIAGNOSIS — R1012 Left upper quadrant pain: Secondary | ICD-10-CM | POA: Diagnosis not present

## 2018-06-25 DIAGNOSIS — K582 Mixed irritable bowel syndrome: Secondary | ICD-10-CM | POA: Diagnosis not present

## 2018-06-28 ENCOUNTER — Other Ambulatory Visit: Payer: BLUE CROSS/BLUE SHIELD

## 2018-06-28 ENCOUNTER — Other Ambulatory Visit: Payer: Self-pay

## 2018-06-28 DIAGNOSIS — Z5181 Encounter for therapeutic drug level monitoring: Secondary | ICD-10-CM | POA: Diagnosis not present

## 2018-06-28 DIAGNOSIS — E7800 Pure hypercholesterolemia, unspecified: Secondary | ICD-10-CM

## 2018-06-29 LAB — HEPATIC FUNCTION PANEL
ALT: 65 IU/L — ABNORMAL HIGH (ref 0–44)
AST: 31 IU/L (ref 0–40)
Albumin: 4.5 g/dL (ref 4.0–5.0)
Alkaline Phosphatase: 65 IU/L (ref 39–117)
Bilirubin Total: 0.5 mg/dL (ref 0.0–1.2)
Bilirubin, Direct: 0.18 mg/dL (ref 0.00–0.40)
Total Protein: 6.5 g/dL (ref 6.0–8.5)

## 2018-06-29 LAB — LIPID PANEL
Chol/HDL Ratio: 3.7 ratio (ref 0.0–5.0)
Cholesterol, Total: 161 mg/dL (ref 100–199)
HDL: 44 mg/dL (ref >39)
LDL Calculated: 98 mg/dL (ref 0–99)
Triglycerides: 93 mg/dL (ref 0–149)
VLDL Cholesterol Cal: 19 mg/dL (ref 5–40)

## 2018-07-20 ENCOUNTER — Other Ambulatory Visit: Payer: Self-pay | Admitting: Family Medicine

## 2018-07-20 DIAGNOSIS — E559 Vitamin D deficiency, unspecified: Secondary | ICD-10-CM

## 2018-07-26 ENCOUNTER — Other Ambulatory Visit: Payer: Self-pay | Admitting: Family Medicine

## 2018-07-26 DIAGNOSIS — E7800 Pure hypercholesterolemia, unspecified: Secondary | ICD-10-CM

## 2018-08-28 DIAGNOSIS — K582 Mixed irritable bowel syndrome: Secondary | ICD-10-CM | POA: Diagnosis not present

## 2018-09-17 NOTE — Progress Notes (Signed)
Chief Complaint  Patient presents with  . Annual Exam  . urinary problem    takes time before stream   . Constipation    couldn't go for a week this week/seen gastro and ct was fine     Scott Hensley is a 48 y.o. male who presents for a complete physical.  He has the following concerns:  Some urinary hesitancy noted, and stream isn't continuous (stops and then voids some more).  Symptoms ongoing for 1 year, not acutely worse. No dysuria, hematuria. He saw Dr. Retta Dionesahlstedt (urologist) in 2017 for similar complaints. He was given some dietary measures to try (per notes). His wife thinks he was treated with a medication (not in records).  Symptoms had resolved. He avoids soda, occasional beer at gathering. No caffeine. Taking metamucil daily per his GI.  Some erectile issues in the last 2 weeks. Unable to maintain erection. 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 Gets a slight gassy/bloat in the LLQ when having the erectile problems.  This is worse in the evenings. He has been having a lot of discomfort in abdomen, back. Complaining of pressure at his left mid-lower back, which feels "like a balloon that is expanded too far", maybe like gas.  Worse with sitting. Sometimes getting shooting pains on his left abdomen if he has been sleeping on his left side. Feels like he is sitting on a golfball, like something is blocking him off. He is having problems with constipation.  Constipation: Saw Dr. Randa EvensEdwards in the office in February for constipation.  He then had CT in March, and a f/u virtual visit in May.  He was given robinul for abdominal pain (anti-spasmodic), which was helpful.  He does still find the medication helpful, but still having some discomfort, thinking maybe he needs to take it more often. He was told to increase fiber in his diet and take metamucil daily with water.  CT done 06/19/2018: IMPRESSION: No acute CT findings of the  abdomen or pelvis to explain left upper quadrant abdominal pain. There is sigmoid diverticulosis without evidence of acute diverticulitis. Moderate burden of stool in the colon.  End of Feb through April was doing well.  Increasing discomfort again since May. Bowels are very soft/normal if taking the fiber and fluids.  A lot of alcohol also causes him to empty his bowels.  Denies any diarrhea (except after using a laxative once).   This week he couldn't go for 1.5 days, has urgency, keeps trying and is uncomfortable. Pattern previously was once every morning, and sometimes in the afternoon.  Patient was last seen in 04/2018 for an acute visit.  At that time he was complaining of itchy, dry and slightly numb palms bilaterally, which had started just after he had a GI illness. (constipated, gassy, discomfort on left side, followed by a lot of diarrhea).  He described slight numbness of the palms only, on both hands.  Minimal tingling in the palms, intermittent. No weakness.  Things don't really feel the same, "like suede".  He was given topical steroids, saw dermatologist who prescribed stronger steroids. Eventually he changed to Aveeno and Petroleum jelly.  It resolved after about 3 weeks, moved down and the same thing happened to his feet.  All resolved, no further problems. Sensation has all resolved.  Hyperlipidemia:  At his visit in January, he admitted being off his statin for about 2 years, having run out of medication (  no intolerance or side effects). Labs prior to starting atorvastatin, in 04/2018: Cholesterol, Total 100 - 199 mg/dL [id number]     Triglycerides 0 - 149 mg/dL [id number]     HDL >62>39 mg/dL 49    VLDL Cholesterol Cal 5 - 40 mg/dL 39    LDL Calculated 0 - 99 mg/dL [id number]     Chol/HDL Ratio 0.0 - 5.0 ratio 1.6WVPX5.5High      He was restarted on atorvastatin, and is doing well, denies side effects. Recheck after after starting atorvastatin 20mg :  Lab Results  Component Value Date    CHOL 161 06/28/2018   HDL 44 06/28/2018   LDLCALC 98 06/28/2018   TRIG 93 06/28/2018   CHOLHDL 3.7 06/28/2018   Vitamin D deficiency:  Vitamin D-OH level was low at 9.9 in 04/2018.  He was prescribed 12 weeks of 50,000 IU qwk. He completed this prescription, but is not currently taking any supplement.   Other recent labs:   Chemistry      Component Value Date/Time   NA 143 04/29/2018 1015   K 4.8 04/29/2018 1015   CL 105 04/29/2018 1015   CO2 22 04/29/2018 1015   BUN 19 04/29/2018 1015   CREATININE 1.26 04/29/2018 1015   CREATININE 1.04 09/28/2014 0001      Component Value Date/Time   CALCIUM 9.8 04/29/2018 1015   ALKPHOS 65 06/28/2018 0835   AST 31 06/28/2018 0835   ALT 65 (H) 06/28/2018 0835   BILITOT 0.5 06/28/2018 0835     Lab Results  Component Value Date   TSH 1.970 04/29/2018   Lab Results  Component Value Date   WBC 4.8 04/29/2018   HGB 15.6 04/29/2018   HCT 45.8 04/29/2018   MCV 86 04/29/2018   PLT 269 04/29/2018   Lab Results  Component Value Date   HGBA1C 5.4 04/29/2018     Immunization History  Administered Date(s) Administered  . Influenza,inj,Quad PF,6+ Mos 04/29/2018  . Tdap 10/07/2014   Last colonoscopy: 10/2010 Dr. Randa EvensEdwards; diverticulosis noted Last PSA: never Dentist: twice a year Ophtho: never Exercise:  45 minutes daily in the last week (treadmill, yardwork). Prior to that, just sporadic.  Past Medical History:  Diagnosis Date  . Constipation   . Diverticulosis 10/2010   Dr. Randa EvensEdwards, seen on CT  . Hyperlipidemia   . IBS (irritable bowel syndrome)    Dr. Randa EvensEdwards    Past Surgical History:  Procedure Laterality Date  . COLONOSCOPY  10/2010   Dr. Randa EvensEdwards  . ESOPHAGOGASTRODUODENOSCOPY  12/03/07   Dr.Edwards-Small hiatal hernia. Mod-severe esophagitis  . NASAL SEPTUM SURGERY  2007    Social History   Socioeconomic History  . Marital status: Married    Spouse name: Not on file  . Number of children: 4  . Years of education:  Not on file  . Highest education level: Not on file  Occupational History  . Occupation: attorney    Employer: [employer]  Social Needs  . Financial resource strain: Not on file  . Food insecurity    Worry: Not on file    Inability: Not on file  . Transportation needs    Medical: Not on file    Non-medical: Not on file  Tobacco Use  . Smoking status: Never Smoker  . Smokeless tobacco: Never Used  Substance and Sexual Activity  . Alcohol use: Yes    Alcohol/week: 0.0 standard drinks    Comment: 6-10/week (when social, over summer)  . Drug use: No  .  Sexual activity: Yes    Partners: Female  Lifestyle  . Physical activity    Days per week: Not on file    Minutes per session: Not on file  . Stress: Not on file  Relationships  . Social Musicianconnections    Talks on phone: Not on file    Gets together: Not on file    Attends religious service: Not on file    Active member of club or organization: Not on file    Attends meetings of clubs or organizations: Not on file    Relationship status: Not on file  . Intimate partner violence    Fear of current or ex partner: Not on file    Emotionally abused: Not on file    Physically abused: Not on file    Forced sexual activity: Not on file  Other Topics Concern  . Not on file  Social History Narrative   Lives with wife, 4 kids, Boston Terrier and a Publishing copybulldog, 1 cat.   Worker's Comp attorney    Family History  Problem Relation Age of Onset  . Cancer Mother        skin  . Colon polyps Mother   . Thyroid disease Mother        ?nodule (in 430's)  . Colonic polyp Mother   . Dementia Mother   . Diabetes Father   . Hyperlipidemia Father   . Heart disease Father        CABG at [id number]  . Urolithiasis Father   . Ulcers Father   . Cancer Father        skin  . Hyperlipidemia Brother   . Cancer Brother        skin  . Cancer Maternal Grandmother        colon cancer (mid 10380's)  . Diabetes Paternal Uncle     Outpatient  Encounter Medications as of 09/19/2018  Medication Sig Note  . atorvastatin (LIPITOR) 20 MG tablet TAKE 1 TABLET BY MOUTH EVERY DAY   . glycopyrrolate (ROBINUL) 2 MG tablet Take 2 mg by mouth 2 (two) times daily as needed.   . Vitamin D, Ergocalciferol, (DRISDOL) 1.25 MG (50000 UT) CAPS capsule Take 1 capsule (50,000 Units total) by mouth every 7 (seven) days. (Patient not taking: Reported on 09/19/2018) 09/19/2018: Completed course  . [DISCONTINUED] triamcinolone cream (KENALOG) 0.1 % USE SPARINGLY TWICE DAILY TO AFFECTED AREAS OF SKIN ONLY, FOR UP TO 2 WEEKS    No facility-administered encounter medications on file as of 09/19/2018.     No Known Allergies   ROS:  The patient denies anorexia, fever, headaches,  vision loss (trouble seeing close up), decreased hearing, ear pain, hoarseness, chest pain, palpitations, dizziness, syncope, dyspnea on exertion, cough, swelling, nausea, vomiting, diarrhea, melena, hematochezia, indigestion/heartburn, hematuria, incontinence, erectile dysfunction, nocturia, weakened urine stream, dysuria, genital lesions, joint pains, numbness, tingling, weakness, tremor, suspicious skin lesions, depression, anxiety, abnormal bleeding/bruising, or enlarged lymph nodes Some numbness to his right lateral thigh/hip; has had for a long time, only episodic with certain positions. He had epidermoid cyst on left shoulder removed earlier this year by dermatologist Constipation and abdominal pain per HPI ED per HPI Urinary complaints per HPI   PHYSICAL EXAM:  BP 110/80   Pulse 75   Temp 98.2 F (36.8 C) (Oral)   Ht 6\' 5"  (1.956 m)   Wt 285 lb (129.3 kg)   SpO2 98%   BMI 33.80 kg/m   Wt Readings from Last 3 Encounters:  09/19/18 285 lb (129.3 kg)  04/29/18 291 lb (132 kg)  10/07/14 267 lb 12.8 oz (121.5 kg)    General Appearance:    Alert, cooperative, no distress, appears stated age  Head:    Normocephalic, without obvious abnormality, atraumatic  Eyes:     PERRL, conjunctiva/corneas clear, EOM's intact, fundi    benign  Ears:    Normal TM's and external ear canals  Nose:  Not examined (wearing mask due to COVID-19 pandemic)  Throat:  Not examined (wearing mask due to COVID-19 pandemic)  Neck:   Supple, no lymphadenopathy;  thyroid:  no enlargement/ tenderness/nodules; no carotid bruit or JVD  Back:    Spine nontender, no curvature, ROM normal, no CVA tenderness  Lungs:     Clear to auscultation bilaterally without wheezes, rales or  ronchi; respirations unlabored  Chest Wall:    No tenderness or deformity   Heart:    Regular rate and rhythm, S1 and S2 normal, no murmur, rub   or gallop  Breast Exam:    No chest wall tenderness, masses or gynecomastia  Abdomen:     Soft, non-tender, nondistended, normoactive bowel sounds,    no masses, no hepatosplenomegaly  Genitalia:    Normal male external genitalia without lesions.  Testicles without masses.  No inguinal hernias.  Rectal:    Normal sphincter tone, no masses or tenderness; guaiac negative stool. External hemorrhoids, mildly inflamed, not bleeding. Prostate smooth, mildly enlarged, nontender, no nodules.  Extremities:   No clubbing, cyanosis or edema  Pulses:   2+ and symmetric all extremities  Skin:   Skin color, texture, turgor normal, no rashes. WHSS at left posterior shoulder (cyst excised)  Lymph nodes:   Cervical, supraclavicular, and axillary nodes normal  Neurologic:   CNII-XII intact, normal strength, sensation and gait; reflexes 2+ and symmetric throughout                                Psych:   Normal mood, affect, hygiene and grooming.    ASSESSMENT/PLAN:  Annual physical exam - Plan: Glucose, random, VITAMIN D 25 Hydroxy (Vit-D Deficiency, Fractures), PSA, Hepatic function panel  Pure hypercholesterolemia - continue statin  Vitamin D deficiency - daily supplement recommended - Plan: VITAMIN D 25 Hydroxy (Vit-D Deficiency, Fractures)  Elevated LFTs - ddx reviewed, most  likely fatty liver; alcohol exacerbates, counseled to cut back - Plan: Hepatic function panel  Erectile dysfunction, unspecified erectile dysfunction type - Plan: tadalafil (CIALIS) 20 MG tablet  Constipation, unspecified constipation type - counseled re: diet, water, exercise. Cont fiber supplement. Recommended Miralax (titrate to effect, start daily); colonoscopy planned   Class 1 obesity due to excess calories without serious comorbidity with body mass index (BMI) of 33.0 to 33.9 in adult - counseled re: healthy diet, exercise, wt loss and risks of obesity  Urinary symptom or sign - ongoing, recurrent.  Rec f/u with urologist. Cont to limit caffeine, decongestants. ?if constipation contributing    Discussed PSA screening (risks/benefits) briefly--age 75, recommended at least 30 minutes of aerobic activity at least 5 days/week; proper sunscreen use reviewed; healthy diet and alcohol recommendations (less than or equal to 2 drinks/day) reviewed; regular seatbelt use; changing batteries in smoke detectors. Self-testicular exams. Immunization recommendations discussed--yearly flu shots are recommended.  Colonoscopy recommendations reviewed, UTD, but planning to repeat soon due to ongoing GI complaints.  F/u 1 year, sooner prn

## 2018-09-17 NOTE — Patient Instructions (Addendum)
  HEALTH MAINTENANCE RECOMMENDATIONS:  It is recommended that you get at least 30 minutes of aerobic exercise at least 5 days/week (for weight loss, you may need as much as 60-90 minutes). This can be any activity that gets your heart rate up. This can be divided in 10-15 minute intervals if needed, but try and build up your endurance at least once a week.  Weight bearing exercise is also recommended twice weekly.  Eat a healthy diet with lots of vegetables, fruits and fiber.  "Colorful" foods have a lot of vitamins (ie green vegetables, tomatoes, red peppers, etc).  Limit sweet tea, regular sodas and alcoholic beverages, all of which has a lot of calories and sugar.  Up to 2 alcoholic drinks daily may be beneficial for men (unless trying to lose weight, watch sugars).  Drink a lot of water.  Sunscreen of at least SPF 30 should be used on all sun-exposed parts of the skin when outside between the hours of 10 am and 4 pm (not just when at beach or pool, but even with exercise, golf, tennis, and yard work!)  Use a sunscreen that says "broad spectrum" so it covers both UVA and UVB rays, and make sure to reapply every 1-2 hours.  Remember to change the batteries in your smoke detectors when changing your clock times in the spring and fall. Carbon monoxide detectors are recommended for your home.  Use your seat belt every time you are in a car, and please drive safely and not be distracted with cell phones and texting while driving.  Be sure to drink plenty of water and continue high fiber diet, fiber supplement. Consider taking Miralax--start taking it once daily, and back off on frequency or dose if stools become too loose or too frequent.  Please start taking Vitamin D3 2000 IU every day.  If your level is low again, and you are put back on prescription (which I suspect will be the case), stop the over-the-counter and resume it once you finish the prescription. Ultimately you should be taking vitamin D  supplement everyday, long-term.  We are sending in Cialis prescription to try and help with the erectile problems.  Start with half tablet, increase to the full only if the 1/2 tablet isn't effective.  I recommend that you follow up with [facility] for your urinary complaints.  Please cut back on the alcohol (no more than 2-3 drinks at a time, ideally just 1-2; cutting back further will help with weight loss and help your liver).

## 2018-09-19 ENCOUNTER — Ambulatory Visit (INDEPENDENT_AMBULATORY_CARE_PROVIDER_SITE_OTHER): Payer: BC Managed Care – PPO | Admitting: Family Medicine

## 2018-09-19 ENCOUNTER — Other Ambulatory Visit: Payer: Self-pay

## 2018-09-19 ENCOUNTER — Encounter: Payer: Self-pay | Admitting: Family Medicine

## 2018-09-19 VITALS — BP 110/80 | HR 75 | Temp 98.2°F | Ht 77.0 in | Wt 285.0 lb

## 2018-09-19 DIAGNOSIS — Z0000 Encounter for general adult medical examination without abnormal findings: Secondary | ICD-10-CM

## 2018-09-19 DIAGNOSIS — E559 Vitamin D deficiency, unspecified: Secondary | ICD-10-CM

## 2018-09-19 DIAGNOSIS — N529 Male erectile dysfunction, unspecified: Secondary | ICD-10-CM | POA: Diagnosis not present

## 2018-09-19 DIAGNOSIS — R399 Unspecified symptoms and signs involving the genitourinary system: Secondary | ICD-10-CM

## 2018-09-19 DIAGNOSIS — R7989 Other specified abnormal findings of blood chemistry: Secondary | ICD-10-CM

## 2018-09-19 DIAGNOSIS — E6609 Other obesity due to excess calories: Secondary | ICD-10-CM

## 2018-09-19 DIAGNOSIS — K5900 Constipation, unspecified: Secondary | ICD-10-CM

## 2018-09-19 DIAGNOSIS — E7800 Pure hypercholesterolemia, unspecified: Secondary | ICD-10-CM | POA: Diagnosis not present

## 2018-09-19 DIAGNOSIS — Z6833 Body mass index (BMI) 33.0-33.9, adult: Secondary | ICD-10-CM

## 2018-09-19 DIAGNOSIS — R945 Abnormal results of liver function studies: Secondary | ICD-10-CM

## 2018-09-19 MED ORDER — TADALAFIL 20 MG PO TABS: 10.0000 mg | ORAL_TABLET | ORAL | 5 refills | Status: DC | PRN

## 2018-09-20 LAB — HEPATIC FUNCTION PANEL
ALT: 47 IU/L — ABNORMAL HIGH (ref 0–44)
AST: 32 IU/L (ref 0–40)
Albumin: 5.2 g/dL — ABNORMAL HIGH (ref 4.0–5.0)
Alkaline Phosphatase: 67 IU/L (ref 39–117)
Bilirubin Total: 0.8 mg/dL (ref 0.0–1.2)
Bilirubin, Direct: 0.27 mg/dL (ref 0.00–0.40)
Total Protein: 7.7 g/dL (ref 6.0–8.5)

## 2018-09-20 LAB — VITAMIN D 25 HYDROXY (VIT D DEFICIENCY, FRACTURES): Vit D, 25-Hydroxy: 30.4 ng/mL (ref 30.0–100.0)

## 2018-09-20 LAB — GLUCOSE, RANDOM: Glucose: 94 mg/dL (ref 65–99)

## 2018-09-20 LAB — PSA: Prostate Specific Ag, Serum: 0.6 ng/mL (ref 0.0–4.0)

## 2018-10-19 ENCOUNTER — Other Ambulatory Visit: Payer: Self-pay | Admitting: Family Medicine

## 2018-10-19 DIAGNOSIS — E7800 Pure hypercholesterolemia, unspecified: Secondary | ICD-10-CM

## 2018-11-03 ENCOUNTER — Telehealth: Payer: Self-pay | Admitting: Family Medicine

## 2018-11-03 DIAGNOSIS — N529 Male erectile dysfunction, unspecified: Secondary | ICD-10-CM

## 2018-11-03 NOTE — Telephone Encounter (Signed)
P.A. TADALAFIL 

## 2018-11-05 NOTE — Telephone Encounter (Signed)
P.A. denied, pt needs trial of both generic Viagra & Tadalafil 5mg , do you want to switch?

## 2018-11-05 NOTE — Telephone Encounter (Signed)
Can change to Tadafanil 5mg , with new instructions on bottle to say take 1 tablet daily OR take 2-4 tablets prn for erectile dysfunction. #30 with 5 refills. Please advise pt of the reason for the change and of the change in directions (taking 2-4 tablets at a time is the same as him taking 1/2 - 1 tablet of the 20mg  he previously took)

## 2018-11-06 MED ORDER — TADALAFIL 5 MG PO TABS: 5.0000 mg | ORAL_TABLET | Freq: Every day | ORAL | 5 refills | Status: DC | PRN

## 2018-11-06 NOTE — Telephone Encounter (Signed)
Called pharmacy & changed to 5mg  and went thru for $16.  Left message for pt

## 2018-11-18 ENCOUNTER — Other Ambulatory Visit: Payer: Self-pay

## 2018-11-18 ENCOUNTER — Ambulatory Visit (INDEPENDENT_AMBULATORY_CARE_PROVIDER_SITE_OTHER): Payer: BC Managed Care – PPO | Admitting: Bariatrics

## 2018-11-18 ENCOUNTER — Encounter (INDEPENDENT_AMBULATORY_CARE_PROVIDER_SITE_OTHER): Payer: Self-pay | Admitting: Bariatrics

## 2018-11-18 VITALS — BP 104/73 | HR 68 | Temp 98.3°F | Ht 77.0 in | Wt 284.0 lb

## 2018-11-18 DIAGNOSIS — Z0289 Encounter for other administrative examinations: Secondary | ICD-10-CM

## 2018-11-18 DIAGNOSIS — R5383 Other fatigue: Secondary | ICD-10-CM

## 2018-11-18 DIAGNOSIS — E7800 Pure hypercholesterolemia, unspecified: Secondary | ICD-10-CM

## 2018-11-18 DIAGNOSIS — E559 Vitamin D deficiency, unspecified: Secondary | ICD-10-CM | POA: Diagnosis not present

## 2018-11-18 DIAGNOSIS — R0602 Shortness of breath: Secondary | ICD-10-CM | POA: Diagnosis not present

## 2018-11-18 DIAGNOSIS — Z1331 Encounter for screening for depression: Secondary | ICD-10-CM

## 2018-11-18 DIAGNOSIS — K5900 Constipation, unspecified: Secondary | ICD-10-CM

## 2018-11-18 DIAGNOSIS — Z6833 Body mass index (BMI) 33.0-33.9, adult: Secondary | ICD-10-CM

## 2018-11-18 DIAGNOSIS — E669 Obesity, unspecified: Secondary | ICD-10-CM

## 2018-11-18 DIAGNOSIS — Z9189 Other specified personal risk factors, not elsewhere classified: Secondary | ICD-10-CM | POA: Diagnosis not present

## 2018-11-18 MED ORDER — VITAMIN D (ERGOCALCIFEROL) 1.25 MG (50000 UNIT) PO CAPS: 50000.0000 [IU] | ORAL_CAPSULE | ORAL | 0 refills | Status: DC

## 2018-11-19 LAB — HEMOGLOBIN A1C
Est. average glucose Bld gHb Est-mCnc: 111 mg/dL
Hgb A1c MFr Bld: 5.5 % (ref 4.8–5.6)

## 2018-11-19 LAB — T4, FREE: Free T4: 1.12 ng/dL (ref 0.82–1.77)

## 2018-11-19 LAB — INSULIN, RANDOM: INSULIN: 11.9 u[IU]/mL (ref 2.6–24.9)

## 2018-11-19 LAB — T3: T3, Total: 151 ng/dL (ref 71–180)

## 2018-11-19 LAB — TSH: TSH: 2.27 u[IU]/mL (ref 0.450–4.500)

## 2018-11-19 NOTE — Progress Notes (Signed)
Office: [phone]  /  Fax: [phone]   Dear Dr. Lynelle DoctorKnapp,   Thank you for referring Scott BaileyJoel W Heier to our clinic. The following note includes my evaluation and treatment recommendations.  HPI:   Chief Complaint: OBESITY    Scott Hensley has been referred by Joselyn ArrowEve Knapp, MD for consultation regarding his obesity and obesity related comorbidities.    Scott BaileyJoel W Hensley (MR# [id number]) is a 48 y.o. male who presents on 11/18/2018 for obesity evaluation and treatment. Current BMI is Body mass index is 33.68 kg/m.Scott Hensley. [place] has been struggling with his weight for many years and has been unsuccessful in either losing weight, maintaining weight loss, or reaching his healthy weight goal.     Scott DowseJoel attended our information session and states he is currently in the action stage of change and ready to dedicate time achieving and maintaining a healthier weight. Scott DowseJoel is interested in becoming our patient and working on intensive lifestyle modifications including (but not limited to) diet, exercise and weight loss.    Scott DowseJoel states his family eats meals together he thinks his family will eat healthier with  him his desired weight loss is 54 to 59 lbs. he has been heavy most of  his life he started gaining weight in elementary school his heaviest weight ever was 295 lbs. he craves chicken and chips  he snacks occasionally in the evenings he skips meals sometimes he is frequently drinking liquids with calories he states that he makes poor food choices he frequently eats larger portions than normal  he states that he eats until he is stuffed he struggles with emotional eating    Fatigue Scott DowseJoel feels his energy is lower than it should be. This has worsened with weight gain and has not worsened recently. Scott DowseJoel admits to daytime somnolence and he denies waking up still tired. Patient is at risk for obstructive sleep apnea. Patent has a history of symptoms of daytime fatigue. Patient generally gets 8 or 9 hours of sleep  per night, and states they generally have restful sleep. Snoring is not present. Apneic episodes are present. Epworth Sleepiness Score is 4  Dyspnea on exertion Scott DowseJoel notes increasing shortness of breath with certain activities and seems to be worsening over time with weight gain. He notes getting out of breath sooner with activity than he used to. This has not gotten worse recently. Scott DowseJoel denies orthopnea.  Pure Hypercholesterolemia Scott DowseJoel has hypercholesterolemia and he is taking Atorvastatin. Scott DowseJoel has a family history of hypercholesterolemia. He is attempting to improve his cholesterol levels with intensive lifestyle modification including a low saturated fat diet, exercise and weight loss. He denies polyphagia.  Vitamin D deficiency Scott DowseJoel has a diagnosis of vitamin D deficiency. He is not currently taking vit D and his last vitamin D level was at 30.4. Scott DowseJoel denies nausea, vomiting or muscle weakness.  At risk for osteopenia and osteoporosis Scott DowseJoel is at higher risk of osteopenia and osteoporosis due to vitamin D deficiency.   Constipation Scott DowseJoel notes constipation and he is taking metamucil. He states BM are less frequent and are not hard and painful. He denies hematochezia or melena.   Depression Screen Nyan's Food and Mood (modified PHQ-9) score was  Depression screen PHQ 2/9 11/18/2018  Decreased Interest 1  Down, Depressed, Hopeless 2  PHQ - 2 Score 3  Altered sleeping 0  Tired, decreased energy 2  Change in appetite 3  Feeling bad or failure about yourself  2  Trouble concentrating 0  Moving slowly  or fidgety/restless 0  Suicidal thoughts 0  PHQ-9 Score 10  Difficult doing work/chores Not difficult at all    ASSESSMENT AND PLAN:  Other fatigue - Plan: EKG 12-Lead, Hemoglobin A1c, Insulin, random, T3, T4, free, TSH  Shortness of breath on exertion  Pure hypercholesterolemia  Vitamin D deficiency - Plan: Vitamin D, Ergocalciferol, (DRISDOL) 1.25 MG (50000 UT) CAPS  capsule  Constipation, unspecified constipation type  Depression screening  At risk for osteoporosis  Class 1 obesity with serious comorbidity and body mass index (BMI) of 33.0 to 33.9 in adult, unspecified obesity type  PLAN:  Fatigue Scott DowseJoel was informed that his fatigue may be related to obesity, depression or many other causes. Labs will be ordered, and in the meanwhile Scott DowseJoel has agreed to work on diet, exercise and weight loss to help with fatigue. Proper sleep hygiene was discussed including the need for 7-8 hours of quality sleep each night. A sleep study was not ordered based on symptoms and Epworth score.  Dyspnea on exertion Prescott's shortness of breath appears to be obesity related and exercise induced. He has agreed to work on weight loss and gradually increase exercise to treat his exercise induced shortness of breath. If Scott DowseJoel follows our instructions and loses weight without improvement of his shortness of breath, we will plan to refer to pulmonology. We will monitor this condition regularly. Scott DowseJoel agrees to this plan.  Pure Hypercholesterolemia Scott DowseJoel was informed of the American Heart Association Guidelines emphasizing intensive lifestyle modifications as the first line treatment for hypercholesterolemia. We discussed many lifestyle modifications today in depth, and Scott DowseJoel will continue to work on decreasing saturated fats such as fatty red meat, butter and many fried foods. He will also increase vegetables and lean protein in his diet and begin to work on exercise and weight loss efforts. Scott DowseJoel will continue his medications and follow up with his PCP.  Vitamin D Deficiency Scott DowseJoel was informed that low vitamin D levels contributes to fatigue and are associated with obesity, breast, and colon cancer. He agrees to continue to take prescription Vit D @50 ,000 IU every week #4 with no refills and will follow up for routine testing of vitamin D, at least 2-3 times per year. He was informed of the  risk of over-replacement of vitamin D and agrees to not increase his dose unless he discusses this with us first.  At risk for osteopenia and osteoporosis Scott DowseJoel was given extended  (15 minutes) osteoporosis prevention counseling today. Scott DowseJoel is at risk for osteopenia and osteoporosis due to his vitamin D deficiency. He was encouraged to take his vitamin D and follow his higher calcium diet and increase strengthening exercise to help strengthen his bones and decrease his risk of osteopenia and osteoporosis.  Constipation Scott DowseJoel will continue taking metamucil daily. He was informed decreased bowel movement frequency is normal while losing weight, but stools should not be hard or painful. He was advised to increase his H20 intake and work on increasing his fiber intake. High fiber foods were discussed today.  Depression Screen Scott DowseJoel had a moderately positive depression screening. Depression is commonly associated with obesity and often results in emotional eating behaviors. We will monitor this closely and work on CBT to help improve the non-hunger eating patterns. Referral to Psychology may be required if no improvement is seen as he continues in our clinic.  Obesity Scott DowseJoel is currently in the action stage of change and his goal is to continue with weight loss efforts. I recommend Scott DowseJoel begin the  structured treatment plan as follows:  He has agreed to follow the category 4 plan  Scott DowseJoel has been instructed to eventually work up to a goal of 150 minutes of combined cardio and strengthening exercise per week for weight loss and overall health benefits. We discussed the following Behavioral Modification Strategies today: planning for success, increase H2O intake, no skipping meals, keeping healthy foods in the home, decrease "bad snacks at home", increasing lean protein intake, decreasing simple carbohydrates, increasing vegetables, decrease eating out and work on meal planning and easy cooking plans   He was  informed of the importance of frequent follow up visits to maximize his success with intensive lifestyle modifications for his multiple health conditions. He was informed we would discuss his lab results at his next visit unless there is a critical issue that needs to be addressed sooner. Scott DowseJoel agreed to keep his next visit at the agreed upon time to discuss these results.  ALLERGIES: No Known Allergies  MEDICATIONS: Current Outpatient Medications on File Prior to Visit  Medication Sig Dispense Refill   atorvastatin (LIPITOR) 20 MG tablet TAKE 1 TABLET BY MOUTH EVERY DAY 90 tablet 0   glycopyrrolate (ROBINUL) 2 MG tablet Take 2 mg by mouth 2 (two) times daily as needed.     tadalafil (CIALIS) 5 MG tablet Take 1 tablet (5 mg total) by mouth daily as needed for erectile dysfunction (take 1 tablet daily OR take 2-4 tablets prn for erectile dysfunction.). (Patient not taking: Reported on 11/18/2018) 30 tablet 5   No current facility-administered medications on file prior to visit.     PAST MEDICAL HISTORY: Past Medical History:  Diagnosis Date   Constipation    Deviated septum    Diverticulosis 10/2010   Dr. Randa EvensEdwards, seen on CT   GERD (gastroesophageal reflux disease)    Hyperlipidemia    IBS (irritable bowel syndrome)    Dr. Randa EvensEdwards   Swallowing difficulty    Vitamin A deficiency    Vitamin D deficiency     PAST SURGICAL HISTORY: Past Surgical History:  Procedure Laterality Date   COLONOSCOPY  10/2010   Dr. Randa EvensEdwards   ESOPHAGOGASTRODUODENOSCOPY  12/03/07   Dr.Edwards-Small hiatal hernia. Mod-severe esophagitis   NASAL SEPTUM SURGERY  2007   WISDOM TOOTH EXTRACTION      SOCIAL HISTORY: Social History   Tobacco Use   Smoking status: Never Smoker   Smokeless tobacco: Never Used  Substance Use Topics   Alcohol use: Yes    Alcohol/week: 0.0 standard drinks    Comment: 6-10/week (when social, over summer)   Drug use: No    FAMILY HISTORY: Family History   Problem Relation Age of Onset   Cancer Mother        skin   Colon polyps Mother    Thyroid disease Mother        ?nodule (in 1030's)   Colonic polyp Mother    Dementia Mother    Anxiety disorder Mother    Obesity Mother    Diabetes Father    Hyperlipidemia Father    Heart disease Father        CABG at 2872   Urolithiasis Father    Ulcers Father    Cancer Father        skin   Sudden death Father    Sleep apnea Father    Obesity Father    Hyperlipidemia Brother    Cancer Brother        skin   Cancer Maternal Grandmother  colon cancer (mid 80's)   Diabetes Paternal Uncle     ROS: Review of Systems  Constitutional: Positive for malaise/fatigue.  HENT: Positive for hearing loss.   Eyes:       Positive for Vision Changes  Respiratory: Positive for shortness of breath (with activity).   Cardiovascular: Negative for orthopnea.  Gastrointestinal: Positive for constipation and heartburn. Negative for melena, nausea and vomiting.       Negative for hematochezia  Musculoskeletal:       Negative for muscle weakness  Skin:       Positive for Dryness  Endo/Heme/Allergies:       Negative for polyphagia    PHYSICAL EXAM: Blood pressure 104/73, pulse 68, temperature 98.3 F (36.8 C), temperature source Oral, height 6\' 5"  (1.956 m), weight 284 lb (128.8 kg), SpO2 97 %. Body mass index is 33.68 kg/m. Physical Exam Vitals signs reviewed.  Constitutional:      Appearance: He is well-developed.  HENT:     Head: Normocephalic and atraumatic.     Nose: Nose normal.  Neck:     Musculoskeletal: Normal range of motion and neck supple.     Thyroid: No thyromegaly.  Cardiovascular:     Rate and Rhythm: Normal rate and regular rhythm.  Pulmonary:     Effort: Pulmonary effort is normal. No respiratory distress.  Abdominal:     Palpations: Abdomen is soft.     Tenderness: There is no abdominal tenderness.  Musculoskeletal: Normal range of motion.      Comments: Range of Motion normal in all 4 extremities  Skin:    General: Skin is warm and dry.  Neurological:     Mental Status: He is alert and oriented to person, place, and time.  Psychiatric:        Behavior: Behavior normal.     RECENT LABS AND TESTS: BMET    Component Value Date/Time   NA 143 04/29/2018 1015   K 4.8 04/29/2018 1015   CL 105 04/29/2018 1015   CO2 22 04/29/2018 1015   GLUCOSE 94 09/19/2018 1441   GLUCOSE 96 09/28/2014 0001   BUN 19 04/29/2018 1015   CREATININE 1.26 04/29/2018 1015   CREATININE 1.04 09/28/2014 0001   CALCIUM 9.8 04/29/2018 1015   GFRNONAA 68 04/29/2018 1015   GFRAA 79 04/29/2018 1015   Lab Results  Component Value Date   HGBA1C 5.5 11/18/2018   Lab Results  Component Value Date   INSULIN 11.9 11/18/2018   CBC    Component Value Date/Time   WBC 4.8 04/29/2018 1015   WBC 5.0 09/28/2014 0001   RBC 5.32 04/29/2018 1015   RBC 5.03 09/28/2014 0001   HGB 15.6 04/29/2018 1015   HCT 45.8 04/29/2018 1015   PLT 269 04/29/2018 1015   MCV 86 04/29/2018 1015   MCH 29.3 04/29/2018 1015   MCH 28.8 09/28/2014 0001   MCHC 34.1 04/29/2018 1015   MCHC 34.0 09/28/2014 0001   RDW 12.3 04/29/2018 1015   LYMPHSABS 1.4 04/29/2018 1015   MONOABS 0.4 09/28/2014 0001   EOSABS 0.1 04/29/2018 1015   BASOSABS 0.1 04/29/2018 1015   Iron/TIBC/Ferritin/ %Sat No results found for: IRON, TIBC, FERRITIN, IRONPCTSAT Lipid Panel     Component Value Date/Time   CHOL 161 06/28/2018 0835   TRIG 93 06/28/2018 0835   HDL 44 06/28/2018 0835   CHOLHDL 3.7 06/28/2018 0835   CHOLHDL 3.8 09/28/2014 0001   VLDL 15 09/28/2014 0001   LDLCALC 98 06/28/2018 0835  Hepatic Function Panel     Component Value Date/Time   PROT 7.7 09/19/2018 1441   ALBUMIN 5.2 (H) 09/19/2018 1441   AST 32 09/19/2018 1441   ALT 47 (H) 09/19/2018 1441   ALKPHOS 67 09/19/2018 1441   BILITOT 0.8 09/19/2018 1441   BILIDIR 0.27 09/19/2018 1441   IBILI 0.7 01/19/2012 0945       Component Value Date/Time   TSH 2.270 11/18/2018 1135   TSH 1.970 04/29/2018 1015   TSH 1.816 09/28/2014 0001     Ref. Range 09/19/2018 14:41  Vitamin D, 25-Hydroxy Latest Ref Range: 30.0 - 100.0 ng/mL 30.4    ECG  shows NSR with a rate of 78 BPM INDIRECT CALORIMETER done today shows a VO2 of 329 and a REE of 2292.  His calculated basal metabolic rate is 1601 thus his basal metabolic rate is worse than expected.      OBESITY BEHAVIORAL INTERVENTION VISIT  Today's visit was # 1   Starting weight: 284 lbs Starting date: 11/18/2018 Today's weight : 284 lbs Today's date: 11/18/2018 Total lbs lost to date: 0    11/18/2018  Height 6\' 5"  (1.956 m)  Weight 284 lb (128.8 kg)  BMI (Calculated) 33.67  BLOOD PRESSURE - SYSTOLIC 093  BLOOD PRESSURE - DIASTOLIC 73  Waist Measurement  49 inches   Body Fat % 34.6 %  Total Body Water (lbs) 124 lbs  RMR 2292    ASK: We discussed the diagnosis of obesity with Servando Salina today and [place]field agreed to give [place] permission to discuss obesity behavioral modification therapy today.  ASSESS: Caius has the diagnosis of obesity and his BMI today is 33.67 Dymir is in the action stage of change   ADVISE: Ladarion was educated on the multiple health risks of obesity as well as the benefit of weight loss to improve his health. He was advised of the need for long term treatment and the importance of lifestyle modifications to improve his current health and to decrease his risk of future health problems.  AGREE: Multiple dietary modification options and treatment options were discussed and  Damyn agreed to follow the recommendations documented in the above note.  ARRANGE: Phi was educated on the importance of frequent visits to treat obesity as outlined per CMS and USPSTF guidelines and agreed to schedule his next follow up appointment today.  Corey Skains, am acting as Location manager for [employer]. Owens Shark, DO  I have reviewed the above documentation  for accuracy and completeness, and I agree with the above. -Jearld Lesch, DO
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
## 2018-12-02 ENCOUNTER — Other Ambulatory Visit: Payer: Self-pay

## 2018-12-02 ENCOUNTER — Encounter (INDEPENDENT_AMBULATORY_CARE_PROVIDER_SITE_OTHER): Payer: Self-pay | Admitting: Bariatrics

## 2018-12-02 ENCOUNTER — Ambulatory Visit (INDEPENDENT_AMBULATORY_CARE_PROVIDER_SITE_OTHER): Payer: BC Managed Care – PPO | Admitting: Bariatrics

## 2018-12-02 VITALS — BP 118/73 | HR 83 | Temp 98.2°F | Ht 77.0 in | Wt 279.0 lb

## 2018-12-02 DIAGNOSIS — E7800 Pure hypercholesterolemia, unspecified: Secondary | ICD-10-CM | POA: Diagnosis not present

## 2018-12-02 DIAGNOSIS — E8881 Metabolic syndrome: Secondary | ICD-10-CM | POA: Diagnosis not present

## 2018-12-02 DIAGNOSIS — E669 Obesity, unspecified: Secondary | ICD-10-CM

## 2018-12-02 DIAGNOSIS — K5909 Other constipation: Secondary | ICD-10-CM | POA: Diagnosis not present

## 2018-12-02 DIAGNOSIS — Z9189 Other specified personal risk factors, not elsewhere classified: Secondary | ICD-10-CM | POA: Diagnosis not present

## 2018-12-02 DIAGNOSIS — E559 Vitamin D deficiency, unspecified: Secondary | ICD-10-CM | POA: Diagnosis not present

## 2018-12-02 DIAGNOSIS — Z6833 Body mass index (BMI) 33.0-33.9, adult: Secondary | ICD-10-CM

## 2018-12-03 ENCOUNTER — Ambulatory Visit (INDEPENDENT_AMBULATORY_CARE_PROVIDER_SITE_OTHER): Payer: BC Managed Care – PPO | Admitting: Psychology

## 2018-12-03 ENCOUNTER — Other Ambulatory Visit: Payer: Self-pay

## 2018-12-03 DIAGNOSIS — F418 Other specified anxiety disorders: Secondary | ICD-10-CM | POA: Diagnosis not present

## 2018-12-04 NOTE — Progress Notes (Signed)
Office: [phone]  /  Fax: [phone]   HPI:   Chief Complaint: OBESITY Scott Hensley is here to discuss his progress with his obesity treatment plan. He is on the Category 2 plan and is following his eating plan approximately 100 % of the time. He states he is exercising 0 minutes 0 times per week. Scott Hensley is down 5 pounds since the last visit. "He is not a big bread guy". His weight is 279 lb (126.6 kg) today and has had a weight loss of 5 pounds over a period of 2 weeks since his last visit. He has lost 5 lbs since starting treatment with [place].  Pure Hypercholesterolemia Scott Hensley has pure hypercholesterolemia and he is taking Lipitor. He has been trying to improve his cholesterol levels with intensive lifestyle modification including a low saturated fat diet, exercise and weight loss. He denies myalgias.  Vitamin D deficiency Scott Hensley has a diagnosis of vitamin D deficiency. He is currently taking high dose vit D and denies nausea, vomiting or muscle weakness.  Insulin Resistance Scott Hensley has a diagnosis of insulin resistance based on his elevated fasting insulin level >5. Although Scott Hensley's blood glucose readings are still under good control, insulin resistance puts him at greater risk of metabolic syndrome and diabetes. His last A1c was at 5.5 and last insulin level was at 11.9 He is not taking metformin currently and continues to work on diet and exercise to decrease risk of diabetes.  Constipation Scott Hensley notes constipation for the last few weeks, worse since attempting weight loss. He states BM are less frequent and are not hard and painful. He denies retained stool.   ASSESSMENT AND PLAN:  Pure hypercholesterolemia  Vitamin D deficiency  Insulin resistance  Other constipation  At risk for diabetes mellitus  Class 1 obesity with serious comorbidity and body mass index (BMI) of 33.0 to 33.9 in adult, unspecified obesity type  PLAN:  Hypercholesterolemia Scott Hensley was informed of the American Heart  Association Guidelines emphasizing intensive lifestyle modifications as the first line treatment for hypercholesterolemia. We discussed many lifestyle modifications today in depth, and Dennie will continue to work on decreasing saturated fats such as fatty red meat, butter and many fried foods. He will also increase vegetables and lean protein in his diet and continue to work on exercise and weight loss efforts. Shadrack will continue lipitor and he will follow up at the agreed upon time.   Vitamin D Deficiency Scott Hensley was informed that low vitamin D levels contributes to fatigue and are associated with obesity, breast, and colon cancer. He will continue to take prescription Vit D @50 ,000 IU every week (start that) and will follow up for routine testing of vitamin D, at least 2-3 times per year. He was informed of the risk of over-replacement of vitamin D and agrees to not increase his dose unless he discusses this with [place] first.  Insulin Resistance Scott Hensley will continue to work on weight loss, exercise, and decreasing simple carbohydrates in his diet to help decrease the risk of diabetes. He was informed that eating too many simple carbohydrates or too many calories at one sitting increases the likelihood of GI side effects. Handouts for insulin resistance and prediabetes were given to patient today. Scott Hensley agreed to follow up with [place] as directed to monitor his progress.  Constipation Scott Hensley was informed decrease bowel movement frequency is normal while losing weight, but stools should not be hard or painful. He was advised to increase his H20 intake to 64 ounces and work  on increasing raw vegetables. He will begin wheat bran or flax seed and he will follow up at the agreed upon time.  Obesity Scott Hensley is currently in the action stage of change. As such, his goal is to continue with weight loss efforts He has agreed to follow the Category 2 plan Scott Hensley will begin using the treadmill and walking and he will alternate every  other day (30) for weight loss and overall health benefits. We discussed the following Behavioral Modification Strategies today: increase H2O intake, no skipping meals, keeping healthy foods in the home, increasing lean protein intake, decreasing simple carbohydrates, increasing vegetables, decrease eating out and work on meal planning and intentional eating Additional Category 1 and Category 2 breakfast options were given to patient today.  Scott Hensley has agreed to follow up with our clinic in 3 weeks. He was informed of the importance of frequent follow up visits to maximize his success with intensive lifestyle modifications for his multiple health conditions.  ALLERGIES: No Known Allergies  MEDICATIONS: Current Outpatient Medications on File Prior to Visit  Medication Sig Dispense Refill  . atorvastatin (LIPITOR) 20 MG tablet TAKE 1 TABLET BY MOUTH EVERY DAY 90 tablet 0  . glycopyrrolate (ROBINUL) 2 MG tablet Take 2 mg by mouth 2 (two) times daily as needed.    . tadalafil (CIALIS) 5 MG tablet Take 1 tablet (5 mg total) by mouth daily as needed for erectile dysfunction (take 1 tablet daily OR take 2-4 tablets prn for erectile dysfunction.). 30 tablet 5  . Vitamin D, Ergocalciferol, (DRISDOL) 1.25 MG (50000 UT) CAPS capsule Take 1 capsule (50,000 Units total) by mouth every 7 (seven) days. 4 capsule 0   No current facility-administered medications on file prior to visit.     PAST MEDICAL HISTORY: Past Medical History:  Diagnosis Date  . Constipation   . Deviated septum   . Diverticulosis 10/2010   Dr. Randa EvensEdwards, seen on CT  . GERD (gastroesophageal reflux disease)   . Hyperlipidemia   . IBS (irritable bowel syndrome)    Dr. Randa EvensEdwards  . Swallowing difficulty   . Vitamin A deficiency   . Vitamin D deficiency     PAST SURGICAL HISTORY: Past Surgical History:  Procedure Laterality Date  . COLONOSCOPY  10/2010   Dr. Randa EvensEdwards  . ESOPHAGOGASTRODUODENOSCOPY  12/03/07   Dr.Edwards-Small hiatal  hernia. Mod-severe esophagitis  . NASAL SEPTUM SURGERY  2007  . WISDOM TOOTH EXTRACTION      SOCIAL HISTORY: Social History   Tobacco Use  . Smoking status: Never Smoker  . Smokeless tobacco: Never Used  Substance Use Topics  . Alcohol use: Yes    Alcohol/week: 0.0 standard drinks    Comment: 6-10/week (when social, over summer)  . Drug use: No    FAMILY HISTORY: Family History  Problem Relation Age of Onset  . Cancer Mother        skin  . Colon polyps Mother   . Thyroid disease Mother        ?nodule (in 4430's)  . Colonic polyp Mother   . Dementia Mother   . Anxiety disorder Mother   . Obesity Mother   . Diabetes Father   . Hyperlipidemia Father   . Heart disease Father        CABG at 5572  . Urolithiasis Father   . Ulcers Father   . Cancer Father        skin  . Sudden death Father   . Sleep apnea Father   .  Obesity Father   . Hyperlipidemia Brother   . Cancer Brother        skin  . Cancer Maternal Grandmother        colon cancer (mid 43's)  . Diabetes Paternal Uncle     ROS: Review of Systems  Constitutional: Positive for weight loss.  Gastrointestinal: Positive for constipation. Negative for nausea and vomiting.  Musculoskeletal: Negative for myalgias.       Negative for muscle weakness    PHYSICAL EXAM: Blood pressure 118/73, pulse 83, temperature 98.2 F (36.8 C), temperature source Oral, height 6\' 5"  (1.956 m), weight 279 lb (126.6 kg), SpO2 96 %. Body mass index is 33.08 kg/m. Physical Exam Vitals signs reviewed.  Constitutional:      Appearance: Normal appearance. He is well-developed. He is obese.  Cardiovascular:     Rate and Rhythm: Normal rate.  Pulmonary:     Effort: Pulmonary effort is normal.  Musculoskeletal: Normal range of motion.  Skin:    General: Skin is warm and dry.  Neurological:     Mental Status: He is alert and oriented to person, place, and time.  Psychiatric:        Mood and Affect: Mood normal.        Behavior:  Behavior normal.     RECENT LABS AND TESTS: BMET    Component Value Date/Time   NA 143 04/29/2018 1015   K 4.8 04/29/2018 1015   CL 105 04/29/2018 1015   CO2 22 04/29/2018 1015   GLUCOSE 94 09/19/2018 1441   GLUCOSE 96 09/28/2014 0001   BUN 19 04/29/2018 1015   CREATININE 1.26 04/29/2018 1015   CREATININE 1.04 09/28/2014 0001   CALCIUM 9.8 04/29/2018 1015   GFRNONAA 68 04/29/2018 1015   GFRAA 79 04/29/2018 1015   Lab Results  Component Value Date   HGBA1C 5.5 11/18/2018   HGBA1C 5.4 04/29/2018   Lab Results  Component Value Date   INSULIN 11.9 11/18/2018   CBC    Component Value Date/Time   WBC 4.8 04/29/2018 1015   WBC 5.0 09/28/2014 0001   RBC 5.32 04/29/2018 1015   RBC 5.03 09/28/2014 0001   HGB 15.6 04/29/2018 1015   HCT 45.8 04/29/2018 1015   PLT 269 04/29/2018 1015   MCV 86 04/29/2018 1015   MCH 29.3 04/29/2018 1015   MCH 28.8 09/28/2014 0001   MCHC 34.1 04/29/2018 1015   MCHC 34.0 09/28/2014 0001   RDW 12.3 04/29/2018 1015   LYMPHSABS 1.4 04/29/2018 1015   MONOABS 0.4 09/28/2014 0001   EOSABS 0.1 04/29/2018 1015   BASOSABS 0.1 04/29/2018 1015   Iron/TIBC/Ferritin/ %Sat No results found for: IRON, TIBC, FERRITIN, IRONPCTSAT Lipid Panel     Component Value Date/Time   CHOL 161 06/28/2018 0835   TRIG 93 06/28/2018 0835   HDL 44 06/28/2018 0835   CHOLHDL 3.7 06/28/2018 0835   CHOLHDL 3.8 09/28/2014 0001   VLDL 15 09/28/2014 0001   LDLCALC 98 06/28/2018 0835   Hepatic Function Panel     Component Value Date/Time   PROT 7.7 09/19/2018 1441   ALBUMIN 5.2 (H) 09/19/2018 1441   AST 32 09/19/2018 1441   ALT 47 (H) 09/19/2018 1441   ALKPHOS 67 09/19/2018 1441   BILITOT 0.8 09/19/2018 1441   BILIDIR 0.27 09/19/2018 1441   IBILI 0.7 01/19/2012 0945      Component Value Date/Time   TSH 2.270 11/18/2018 1135   TSH 1.970 04/29/2018 1015   TSH 1.816 09/28/2014 0001  Ref. Range 09/19/2018 14:41  Vitamin D, 25-Hydroxy Latest Ref Range: 30.0 -  100.0 ng/mL 30.4    OBESITY BEHAVIORAL INTERVENTION VISIT  Today's visit was # 2   Starting weight: 284 lbs Starting date: 11/18/2018 Today's weight : 279 lbs Today's date: 12/02/2018 Total lbs lost to date: 5    12/02/2018  Height 6\' 5"  (1.956 m)  Weight 279 lb (126.6 kg)  BMI (Calculated) 33.08  BLOOD PRESSURE - SYSTOLIC 118  BLOOD PRESSURE - DIASTOLIC 73   Body Fat % 33.7 %  Total Body Water (lbs) 123.4 lbs    ASK: We discussed the diagnosis of obesity with Unice BaileyJoel W Hensley today and Scott Hensley agreed to give us permission to discuss obesity behavioral modification therapy today.  ASSESS: Scott Hensley has the diagnosis of obesity and his BMI today is 33.08 Scott Hensley is in the action stage of change   ADVISE: Scott Hensley was educated on the multiple health risks of obesity as well as the benefit of weight loss to improve his health. He was advised of the need for long term treatment and the importance of lifestyle modifications to improve his current health and to decrease his risk of future health problems.  AGREE: Multiple dietary modification options and treatment options were discussed and  Scott Hensley agreed to follow the recommendations documented in the above note.  ARRANGE: Scott Hensley was educated on the importance of frequent visits to treat obesity as outlined per CMS and USPSTF guidelines and agreed to schedule his next follow up appointment today.  Cristi LoronI, Joanne Murray, am acting as Energy managertranscriptionist for [employer] [place] Corporationngel A. Manson PasseyBrown, DO  I have reviewed the above documentation for accuracy and completeness, and I agree with the above. -Corinna CapraAngel Nazifa Trinka, DO

## 2018-12-05 ENCOUNTER — Encounter (INDEPENDENT_AMBULATORY_CARE_PROVIDER_SITE_OTHER): Payer: Self-pay | Admitting: Bariatrics

## 2018-12-24 ENCOUNTER — Other Ambulatory Visit: Payer: Self-pay

## 2018-12-24 ENCOUNTER — Ambulatory Visit (INDEPENDENT_AMBULATORY_CARE_PROVIDER_SITE_OTHER): Payer: BC Managed Care – PPO | Admitting: Physician Assistant

## 2018-12-24 ENCOUNTER — Encounter (INDEPENDENT_AMBULATORY_CARE_PROVIDER_SITE_OTHER): Payer: Self-pay | Admitting: Physician Assistant

## 2018-12-24 VITALS — BP 92/56 | HR 86 | Temp 98.3°F

## 2018-12-24 DIAGNOSIS — E669 Obesity, unspecified: Secondary | ICD-10-CM

## 2018-12-24 DIAGNOSIS — E559 Vitamin D deficiency, unspecified: Secondary | ICD-10-CM

## 2018-12-24 DIAGNOSIS — Z6832 Body mass index (BMI) 32.0-32.9, adult: Secondary | ICD-10-CM

## 2018-12-25 NOTE — Progress Notes (Signed)
Office: [phone]  /  Fax: [phone]   HPI:   Chief Complaint: OBESITY Scott Hensley is here to discuss his progress with his obesity treatment plan. He is on the Category 4 plan and is following his eating plan approximately 90 % of the time. He states he is walking on the treadmill 45 minutes 5 to 6 times per week. Terrail did very well over the last two weeks with the plan. His hunger is controlled and he is enjoying what he is eating. He started working out regularly. His weight is (P) 270 lb (122.5 kg) today and has had a weight loss of 9 pounds over a period of 3 weeks since his last visit. He has lost 14 lbs since starting treatment with [place].  Vitamin D deficiency Scott Hensley has a diagnosis of vitamin D deficiency. He is currently taking vit D and denies nausea, vomiting or muscle weakness.  ASSESSMENT AND PLAN:  Vitamin D deficiency  Class 1 obesity with serious comorbidity and body mass index (BMI) of 32.0 to 32.9 in adult, unspecified obesity type  PLAN:  Vitamin D Deficiency Scott Hensley was informed that low vitamin D levels contributes to fatigue and are associated with obesity, breast, and colon cancer. He agrees to continue to take prescription Vit D @50 ,000 IU every week and will follow up for routine testing of vitamin D, at least 2-3 times per year. He was informed of the risk of over-replacement of vitamin D and agrees to not increase his dose unless he discusses this with [place] first.  I spent > than 50% of the 15 minute visit on counseling as documented in the note.  Obesity Scott Hensley is currently in the action stage of change. As such, his goal is to continue with weight loss efforts He has agreed to follow the Category 4 plan Scott Hensley has been instructed to work up to a goal of 150 minutes of combined cardio and strengthening exercise per week for weight loss and overall health benefits. We discussed the following Behavioral Modification Strategies today: increasing lean protein  intake  Scott Hensley has agreed to follow up with our clinic in 2 weeks. He was informed of the importance of frequent follow up visits to maximize his success with intensive lifestyle modifications for his multiple health conditions.  ALLERGIES: No Known Allergies  MEDICATIONS: Current Outpatient Medications on File Prior to Visit  Medication Sig Dispense Refill   atorvastatin (LIPITOR) 20 MG tablet TAKE 1 TABLET BY MOUTH EVERY DAY 90 tablet 0   glycopyrrolate (ROBINUL) 2 MG tablet Take 2 mg by mouth 2 (two) times daily as needed.     tadalafil (CIALIS) 5 MG tablet Take 1 tablet (5 mg total) by mouth daily as needed for erectile dysfunction (take 1 tablet daily OR take 2-4 tablets prn for erectile dysfunction.). 30 tablet 5   Vitamin D, Ergocalciferol, (DRISDOL) 1.25 MG (50000 UT) CAPS capsule Take 1 capsule (50,000 Units total) by mouth every 7 (seven) days. 4 capsule 0   No current facility-administered medications on file prior to visit.     PAST MEDICAL HISTORY: Past Medical History:  Diagnosis Date   Constipation    Deviated septum    Diverticulosis 10/2010   Dr. Randa Evens, seen on CT   GERD (gastroesophageal reflux disease)    Hyperlipidemia    IBS (irritable bowel syndrome)    Dr. Randa Evens   Swallowing difficulty    Vitamin A deficiency    Vitamin D deficiency     PAST SURGICAL HISTORY:  Past Surgical History:  Procedure Laterality Date   COLONOSCOPY  10/2010   Dr. Oletta Lamas   ESOPHAGOGASTRODUODENOSCOPY  12/03/07   Dr.Edwards-Small hiatal hernia. Mod-severe esophagitis   NASAL SEPTUM SURGERY  2007   WISDOM TOOTH EXTRACTION      SOCIAL HISTORY: Social History   Tobacco Use   Smoking status: Never Smoker   Smokeless tobacco: Never Used  Substance Use Topics   Alcohol use: Yes    Alcohol/week: 0.0 standard drinks    Comment: 6-10/week (when social, over summer)   Drug use: No    FAMILY HISTORY: Family History  Problem Relation Age of Onset    Cancer Mother        skin   Colon polyps Mother    Thyroid disease Mother        ?nodule (in 87's)   Colonic polyp Mother    Dementia Mother    Anxiety disorder Mother    Obesity Mother    Diabetes Father    Hyperlipidemia Father    Heart disease Father        CABG at 60   Urolithiasis Father    Ulcers Father    Cancer Father        skin   Sudden death Father    Sleep apnea Father    Obesity Father    Hyperlipidemia Brother    Cancer Brother        skin   Cancer Maternal Grandmother        colon cancer (mid 33's)   Diabetes Paternal Uncle     ROS: Review of Systems  Constitutional: Positive for weight loss.  Gastrointestinal: Negative for nausea and vomiting.  Musculoskeletal:       Negative for muscle weakness    PHYSICAL EXAM: Blood pressure (!) 92/56, pulse 86, temperature 98.3 F (36.8 C), temperature source Oral, height (P) 6\' 5"  (1.956 m), weight (P) 270 lb (122.5 kg), SpO2 98 %. Body mass index is 32.02 kg/m (pended). Physical Exam Vitals signs reviewed.  Constitutional:      Appearance: Normal appearance. He is well-developed. He is obese.  Cardiovascular:     Rate and Rhythm: Normal rate.  Pulmonary:     Effort: Pulmonary effort is normal.  Musculoskeletal: Normal range of motion.  Skin:    General: Skin is warm and dry.  Neurological:     Mental Status: He is alert and oriented to person, place, and time.  Psychiatric:        Mood and Affect: Mood normal.        Behavior: Behavior normal.     RECENT LABS AND TESTS: BMET    Component Value Date/Time   NA 143 04/29/2018 1015   K 4.8 04/29/2018 1015   CL 105 04/29/2018 1015   CO2 22 04/29/2018 1015   GLUCOSE 94 09/19/2018 1441   GLUCOSE 96 09/28/2014 0001   BUN 19 04/29/2018 1015   CREATININE 1.26 04/29/2018 1015   CREATININE 1.04 09/28/2014 0001   CALCIUM 9.8 04/29/2018 1015   GFRNONAA 68 04/29/2018 1015   GFRAA 79 04/29/2018 1015   Lab Results  Component Value  Date   HGBA1C 5.5 11/18/2018   HGBA1C 5.4 04/29/2018   Lab Results  Component Value Date   INSULIN 11.9 11/18/2018   CBC    Component Value Date/Time   WBC 4.8 04/29/2018 1015   WBC 5.0 09/28/2014 0001   RBC 5.32 04/29/2018 1015   RBC 5.03 09/28/2014 0001   HGB 15.6 04/29/2018 1015  HCT 45.8 04/29/2018 1015   PLT 269 04/29/2018 1015   MCV 86 04/29/2018 1015   MCH 29.3 04/29/2018 1015   MCH 28.8 09/28/2014 0001   MCHC 34.1 04/29/2018 1015   MCHC 34.0 09/28/2014 0001   RDW 12.3 04/29/2018 1015   LYMPHSABS 1.4 04/29/2018 1015   MONOABS 0.4 09/28/2014 0001   EOSABS 0.1 04/29/2018 1015   BASOSABS 0.1 04/29/2018 1015   Iron/TIBC/Ferritin/ %Sat No results found for: IRON, TIBC, FERRITIN, IRONPCTSAT Lipid Panel     Component Value Date/Time   CHOL 161 06/28/2018 0835   TRIG 93 06/28/2018 0835   HDL 44 06/28/2018 0835   CHOLHDL 3.7 06/28/2018 0835   CHOLHDL 3.8 09/28/2014 0001   VLDL 15 09/28/2014 0001   LDLCALC 98 06/28/2018 0835   Hepatic Function Panel     Component Value Date/Time   PROT 7.7 09/19/2018 1441   ALBUMIN 5.2 (H) 09/19/2018 1441   AST 32 09/19/2018 1441   ALT 47 (H) 09/19/2018 1441   ALKPHOS 67 09/19/2018 1441   BILITOT 0.8 09/19/2018 1441   BILIDIR 0.27 09/19/2018 1441   IBILI 0.7 01/19/2012 0945      Component Value Date/Time   TSH 2.270 11/18/2018 1135   TSH 1.970 04/29/2018 1015   TSH 1.816 09/28/2014 0001     Ref. Range 09/19/2018 14:41  Vitamin D, 25-Hydroxy Latest Ref Range: 30.0 - 100.0 ng/mL 30.4    OBESITY BEHAVIORAL INTERVENTION VISIT  Today's visit was # 3   Starting weight: 284 lbs Starting date: 11/18/2018 Today's weight : 270 lbs Today's date: 12/24/2018 Total lbs lost to date: 14  Height 6'5" Weight 270 lb BMI (calculated) 32.01 BP 92/56 Body Fat% 32.6 Total Body Water (lbs) 126.2  ASK: We discussed the diagnosis of obesity with Unice BaileyJoel W [place] today and [name] agreed to give us permission to discuss obesity behavioral  modification therapy today.  ASSESS: [name] has the diagnosis of obesity and his BMI today is 32.01 [name] is in the action stage of change   ADVISE: [name] was educated on the multiple health risks of obesity as well as the benefit of weight loss to improve his health. He was advised of the need for long term treatment and the importance of lifestyle modifications to improve his current health and to decrease his risk of future health problems.  AGREE: Multiple dietary modification options and treatment options were discussed and  [name] agreed to follow the recommendations documented in the above note.  ARRANGE: [name] was educated on the importance of frequent visits to treat obesity as outlined per CMS and USPSTF guidelines and agreed to schedule his next follow up appointment today.  Cristi LoronI, Joanne Murray, am acting as transcriptionist for Alois Clicheracey Aguilar, PA-C I, Alois Clicheracey Aguilar, PA-C have reviewed above note and agree with its content

## 2019-01-07 ENCOUNTER — Other Ambulatory Visit: Payer: Self-pay

## 2019-01-07 ENCOUNTER — Ambulatory Visit (INDEPENDENT_AMBULATORY_CARE_PROVIDER_SITE_OTHER): Payer: BC Managed Care – PPO | Admitting: Physician Assistant

## 2019-01-07 VITALS — BP 110/66 | HR 87 | Temp 98.3°F | Ht 77.0 in | Wt 265.0 lb

## 2019-01-07 DIAGNOSIS — E559 Vitamin D deficiency, unspecified: Secondary | ICD-10-CM | POA: Diagnosis not present

## 2019-01-07 DIAGNOSIS — Z9189 Other specified personal risk factors, not elsewhere classified: Secondary | ICD-10-CM

## 2019-01-07 DIAGNOSIS — E7849 Other hyperlipidemia: Secondary | ICD-10-CM

## 2019-01-07 DIAGNOSIS — Z6831 Body mass index (BMI) 31.0-31.9, adult: Secondary | ICD-10-CM

## 2019-01-07 DIAGNOSIS — E669 Obesity, unspecified: Secondary | ICD-10-CM

## 2019-01-07 MED ORDER — VITAMIN D (ERGOCALCIFEROL) 1.25 MG (50000 UNIT) PO CAPS: 50000.0000 [IU] | ORAL_CAPSULE | ORAL | 0 refills | Status: DC

## 2019-01-08 NOTE — Progress Notes (Signed)
Office: [phone]  /  Fax: [phone]   HPI:   Chief Complaint: OBESITY Scott Hensley is here to discuss his progress with his obesity treatment plan. He is on the  follow the Category 4 plan and is following his eating plan approximately 95 % of the time. He states he is walking on the treadmill 45 minutes 5 times per week. Doris reports that he has no issues with the plan. He is following it closely. His weight is 265 lb (120.2 kg) today and has had a weight loss of 5 pounds over a period of 2 weeks since his last visit. He has lost 19 lbs since starting treatment with [place].  Vitamin D deficiency Isaic has a diagnosis of vitamin D deficiency. He is currently taking vit D and denies nausea, vomiting or muscle weakness.  At risk for osteopenia and osteoporosis Nadav is at higher risk of osteopenia and osteoporosis due to vitamin D deficiency.   Hyperlipidemia Bertie has hyperlipidemia and he is on Atorvastatin. He has been trying to improve his cholesterol levels with intensive lifestyle modification including a low saturated fat diet, exercise and weight loss. He denies any chest pain.  ASSESSMENT AND PLAN:  Vitamin D deficiency - Plan: Vitamin D, Ergocalciferol, (DRISDOL) 1.25 MG (50000 UT) CAPS capsule  Other hyperlipidemia  At risk for osteoporosis  Class 1 obesity with serious comorbidity and body mass index (BMI) of 31.0 to 31.9 in adult, unspecified obesity type  PLAN:  Vitamin D Deficiency Gino was informed that low vitamin D levels contributes to fatigue and are associated with obesity, breast, and colon cancer. Reice agrees to continue to take prescription Vit D ,000 IU every week #4 with no refills and he will follow up for routine testing of vitamin D, at least 2-3 times per year. He was informed of the risk of over-replacement of vitamin D and agrees to not increase his dose unless he discusses this with [place] first. Kimmie agrees to follow up with our clinic in 2 weeks.  At risk  for osteopenia and osteoporosis Jerald was given extended  (15 minutes) osteoporosis prevention counseling today. Charlis is at risk for osteopenia and osteoporosis due to his vitamin D deficiency. He was encouraged to take his vitamin D and follow his higher calcium diet and increase strengthening exercise to help strengthen his bones and decrease his risk of osteopenia and osteoporosis.  Hyperlipidemia Evon was informed of the American Heart Association Guidelines emphasizing intensive lifestyle modifications as the first line treatment for hyperlipidemia. We discussed many lifestyle modifications today in depth, and Jaxzen will continue to work on decreasing saturated fats such as fatty red meat, butter and many fried foods. He will also increase vegetables and lean protein in his diet and continue to work on exercise and weight loss efforts. Lenny will continue Atorvastatin and follow up as directed.  Obesity Antone is currently in the action stage of change. As such, his goal is to continue with weight loss efforts He has agreed to follow the Category 4 plan Tiny has been instructed to work up to a goal of 150 minutes of combined cardio and strengthening exercise per week for weight loss and overall health benefits. We discussed the following Behavioral Modification Strategies today: keeping healthy foods in the home and work on meal planning and easy cooking plans  Keonta has agreed to follow up with our clinic in 2 weeks. He was informed of the importance of frequent follow up visits to maximize his success  with intensive lifestyle modifications for his multiple health conditions.  ALLERGIES: No Known Allergies  MEDICATIONS: Current Outpatient Medications on File Prior to Visit  Medication Sig Dispense Refill   atorvastatin (LIPITOR) 20 MG tablet TAKE 1 TABLET BY MOUTH EVERY DAY 90 tablet 0   glycopyrrolate (ROBINUL) 2 MG tablet Take 2 mg by mouth 2 (two) times daily as needed.     tadalafil  (CIALIS) 5 MG tablet Take 1 tablet (5 mg total) by mouth daily as needed for erectile dysfunction (take 1 tablet daily OR take 2-4 tablets prn for erectile dysfunction.). 30 tablet 5   No current facility-administered medications on file prior to visit.     PAST MEDICAL HISTORY: Past Medical History:  Diagnosis Date   Constipation    Deviated septum    Diverticulosis 10/2010   Dr. Oletta Lamas, seen on CT   GERD (gastroesophageal reflux disease)    Hyperlipidemia    IBS (irritable bowel syndrome)    Dr. Oletta Lamas   Swallowing difficulty    Vitamin A deficiency    Vitamin D deficiency     PAST SURGICAL HISTORY: Past Surgical History:  Procedure Laterality Date   COLONOSCOPY  10/2010   Dr. Oletta Lamas   ESOPHAGOGASTRODUODENOSCOPY  12/03/07   Dr.Edwards-Small hiatal hernia. Mod-severe esophagitis   NASAL SEPTUM SURGERY  2007   WISDOM TOOTH EXTRACTION      SOCIAL HISTORY: Social History   Tobacco Use   Smoking status: Never Smoker   Smokeless tobacco: Never Used  Substance Use Topics   Alcohol use: Yes    Alcohol/week: 0.0 standard drinks    Comment: 6-10/week (when social, over summer)   Drug use: No    FAMILY HISTORY: Family History  Problem Relation Age of Onset   Cancer Mother        skin   Colon polyps Mother    Thyroid disease Mother        ?nodule (in 50's)   Colonic polyp Mother    Dementia Mother    Anxiety disorder Mother    Obesity Mother    Diabetes Father    Hyperlipidemia Father    Heart disease Father        CABG at 36   Urolithiasis Father    Ulcers Father    Cancer Father        skin   Sudden death Father    Sleep apnea Father    Obesity Father    Hyperlipidemia Brother    Cancer Brother        skin   Cancer Maternal Grandmother        colon cancer (mid 54's)   Diabetes Paternal Uncle     ROS: Review of Systems  Constitutional: Positive for weight loss.  Cardiovascular: Negative for chest pain.    Gastrointestinal: Negative for nausea and vomiting.  Musculoskeletal:       Negative for muscle weakness    PHYSICAL EXAM: Blood pressure 110/66, pulse 87, temperature 98.3 F (36.8 C), temperature source Oral, height 6\' 5"  (1.956 m), weight 265 lb (120.2 kg), SpO2 96 %. Body mass index is 31.42 kg/m. Physical Exam Vitals signs reviewed.  Constitutional:      Appearance: Normal appearance. He is well-developed. He is obese.  Cardiovascular:     Rate and Rhythm: Normal rate.  Pulmonary:     Effort: Pulmonary effort is normal.  Musculoskeletal: Normal range of motion.  Skin:    General: Skin is warm and dry.  Neurological:  Mental Status: He is alert and oriented to person, place, and time.  Psychiatric:        Mood and Affect: Mood normal.        Behavior: Behavior normal.     RECENT LABS AND TESTS: BMET    Component Value Date/Time   NA 143 04/29/2018 1015   K 4.8 04/29/2018 1015   CL 105 04/29/2018 1015   CO2 22 04/29/2018 1015   GLUCOSE 94 09/19/2018 1441   GLUCOSE 96 09/28/2014 0001   BUN 19 04/29/2018 1015   CREATININE 1.26 04/29/2018 1015   CREATININE 1.04 09/28/2014 0001   CALCIUM 9.8 04/29/2018 1015   GFRNONAA 68 04/29/2018 1015   GFRAA 79 04/29/2018 1015   Lab Results  Component Value Date   HGBA1C 5.5 11/18/2018   HGBA1C 5.4 04/29/2018   Lab Results  Component Value Date   INSULIN 11.9 11/18/2018   CBC    Component Value Date/Time   WBC 4.8 04/29/2018 1015   WBC 5.0 09/28/2014 0001   RBC 5.32 04/29/2018 1015   RBC 5.03 09/28/2014 0001   HGB 15.6 04/29/2018 1015   HCT 45.8 04/29/2018 1015   PLT 269 04/29/2018 1015   MCV 86 04/29/2018 1015   MCH 29.3 04/29/2018 1015   MCH 28.8 09/28/2014 0001   MCHC 34.1 04/29/2018 1015   MCHC 34.0 09/28/2014 0001   RDW 12.3 04/29/2018 1015   LYMPHSABS 1.4 04/29/2018 1015   MONOABS 0.4 09/28/2014 0001   EOSABS 0.1 04/29/2018 1015   BASOSABS 0.1 04/29/2018 1015   Iron/TIBC/Ferritin/ %Sat No  results found for: IRON, TIBC, FERRITIN, IRONPCTSAT Lipid Panel     Component Value Date/Time   CHOL 161 06/28/2018 0835   TRIG 93 06/28/2018 0835   HDL 44 06/28/2018 0835   CHOLHDL 3.7 06/28/2018 0835   CHOLHDL 3.8 09/28/2014 0001   VLDL 15 09/28/2014 0001   LDLCALC 98 06/28/2018 0835   Hepatic Function Panel     Component Value Date/Time   PROT 7.7 09/19/2018 1441   ALBUMIN 5.2 (H) 09/19/2018 1441   AST 32 09/19/2018 1441   ALT 47 (H) 09/19/2018 1441   ALKPHOS 67 09/19/2018 1441   BILITOT 0.8 09/19/2018 1441   BILIDIR 0.27 09/19/2018 1441   IBILI 0.7 01/19/2012 0945      Component Value Date/Time   TSH 2.270 11/18/2018 1135   TSH 1.970 04/29/2018 1015   TSH 1.816 09/28/2014 0001     Ref. Range 09/19/2018 14:41  Vitamin D, 25-Hydroxy Latest Ref Range: 30.0 - 100.0 ng/mL 30.4    OBESITY BEHAVIORAL INTERVENTION VISIT  Today's visit was # 4   Starting weight: 284 lbs Starting date: 11/18/2018 Today's weight : 265 lbs Today's date: 01/07/2019 Total lbs lost to date: 19    01/07/2019  Height 6\' 5"  (1.956 m)  Weight 265 lb (120.2 kg)  BMI (Calculated) 31.42  BLOOD PRESSURE - SYSTOLIC 110  BLOOD PRESSURE - DIASTOLIC 66   Body Fat % 31.9 %  Total Body Water (lbs) 124.4 lbs    ASK: We discussed the diagnosis of obesity with Unice BaileyJoel W Crispo today and [name] agreed to give us permission to discuss obesity behavioral modification therapy today.  ASSESS: [name] has the diagnosis of obesity and his BMI today is 831.42 [name] is in the action stage of change   ADVISE: [name] was educated on the multiple health risks of obesity as well as the benefit of weight loss to improve his health. He was advised of the  need for long term treatment and the importance of lifestyle modifications to improve his current health and to decrease his risk of future health problems.  AGREE: Multiple dietary modification options and treatment options were discussed and  Abdoulaye agreed to follow the  recommendations documented in the above note.  ARRANGE: Tye was educated on the importance of frequent visits to treat obesity as outlined per CMS and USPSTF guidelines and agreed to schedule his next follow up appointment today.  Cristi Loron, am acting as transcriptionist for Alois Cliche, PA-C I, Alois Cliche, PA-C have reviewed above note and agree with its content

## 2019-01-13 DIAGNOSIS — M7541 Impingement syndrome of right shoulder: Secondary | ICD-10-CM | POA: Diagnosis not present

## 2019-01-26 DIAGNOSIS — M75111 Incomplete rotator cuff tear or rupture of right shoulder, not specified as traumatic: Secondary | ICD-10-CM | POA: Diagnosis not present

## 2019-01-26 DIAGNOSIS — M19011 Primary osteoarthritis, right shoulder: Secondary | ICD-10-CM | POA: Diagnosis not present

## 2019-01-26 DIAGNOSIS — M7551 Bursitis of right shoulder: Secondary | ICD-10-CM | POA: Diagnosis not present

## 2019-01-28 ENCOUNTER — Ambulatory Visit (INDEPENDENT_AMBULATORY_CARE_PROVIDER_SITE_OTHER): Payer: BC Managed Care – PPO | Admitting: Physician Assistant

## 2019-01-29 ENCOUNTER — Encounter (INDEPENDENT_AMBULATORY_CARE_PROVIDER_SITE_OTHER): Payer: Self-pay | Admitting: Physician Assistant

## 2019-01-29 ENCOUNTER — Ambulatory Visit (INDEPENDENT_AMBULATORY_CARE_PROVIDER_SITE_OTHER): Payer: BC Managed Care – PPO | Admitting: Physician Assistant

## 2019-01-29 ENCOUNTER — Other Ambulatory Visit: Payer: Self-pay

## 2019-01-29 VITALS — BP 104/69 | HR 69 | Temp 97.7°F | Ht 77.0 in | Wt 255.0 lb

## 2019-01-29 DIAGNOSIS — E559 Vitamin D deficiency, unspecified: Secondary | ICD-10-CM | POA: Diagnosis not present

## 2019-01-29 DIAGNOSIS — Z9189 Other specified personal risk factors, not elsewhere classified: Secondary | ICD-10-CM | POA: Diagnosis not present

## 2019-01-29 DIAGNOSIS — E669 Obesity, unspecified: Secondary | ICD-10-CM

## 2019-01-29 DIAGNOSIS — Z6830 Body mass index (BMI) 30.0-30.9, adult: Secondary | ICD-10-CM

## 2019-01-29 DIAGNOSIS — E7849 Other hyperlipidemia: Secondary | ICD-10-CM | POA: Diagnosis not present

## 2019-01-29 MED ORDER — VITAMIN D (ERGOCALCIFEROL) 1.25 MG (50000 UNIT) PO CAPS: 50000.0000 [IU] | ORAL_CAPSULE | ORAL | 0 refills | Status: DC

## 2019-01-30 DIAGNOSIS — S43431A Superior glenoid labrum lesion of right shoulder, initial encounter: Secondary | ICD-10-CM | POA: Diagnosis not present

## 2019-02-03 NOTE — Progress Notes (Signed)
Office: [phone]  /  Fax: [phone]   HPI:   Chief Complaint: OBESITY Scott Hensley is here to discuss his progress with his obesity treatment plan. He is on the Category 4 plan and is following his eating plan approximately 95 % of the time. He states he is walking and jogging 45 minutes 4 to 5 times per week. Scott Hensley is doing well on the plan. He reports that his hunger is well controlled. He just got an MRI of his shoulder and he is awaiting the results, so that he can begin resistance training if possible. His weight is 255 lb (115.7 kg) today and has had a weight loss of 10 pounds over a period of 3 weeks since his last visit. He has lost 29 lbs since starting treatment with [place].  Vitamin D deficiency Scott Hensley has a diagnosis of vitamin D deficiency. Scott Hensley is currently taking vit D and he denies nausea, vomiting or muscle weakness.  Hyperlipidemia Scott Hensley has hyperlipidemia and he is on Atorvastatin. He has been trying to improve his cholesterol levels with intensive lifestyle modification including a low saturated fat diet, exercise and weight loss. He denies any chest pain.  At risk for cardiovascular disease Scott Hensley is at a higher than average risk for cardiovascular disease due to obesity and hyperlipidemia. He currently denies any chest pain.  ASSESSMENT AND PLAN:  Vitamin D deficiency - Plan: Vitamin D, Ergocalciferol, (DRISDOL) 1.25 MG (50000 UT) CAPS capsule  Other hyperlipidemia  At risk for heart disease  Class 1 obesity with serious comorbidity and body mass index (BMI) of 30.0 to 30.9 in adult, unspecified obesity type  PLAN:  Vitamin D Deficiency Scott Hensley was informed that low vitamin D levels contributes to fatigue and are associated with obesity, breast, and colon cancer. Scott Hensley agrees to continue to take prescription Vit D @50 ,000 IU every week #4 with no refills and he will follow up for routine testing of vitamin D, at least 2-3 times per year. He was informed of the risk of  over-replacement of vitamin D and agrees to not increase his dose unless he discusses this with [place] first. Scott Hensley agrees to follow up with our clinic in 3 weeks.  Hyperlipidemia Scott Hensley was informed of the American Heart Association Guidelines emphasizing intensive lifestyle modifications as the first line treatment for hyperlipidemia. We discussed many lifestyle modifications today in depth, and Scott Hensley will continue to work on decreasing saturated fats such as fatty red meat, butter and many fried foods. He will also increase vegetables and lean protein in his diet and continue to work on exercise and weight loss efforts. Scott Hensley will continue atorvastatin and follow up as directed.  Cardiovascular risk counseling Scott Hensley was given extended (15 minutes) coronary artery disease prevention counseling today. He is 48 y.o. male and has risk factors for heart disease including obesity and hyperlipidemia. We discussed intensive lifestyle modifications today with an emphasis on specific weight loss instructions and strategies. Pt was also informed of the importance of increasing exercise and decreasing saturated fats to help prevent heart disease.  Obesity Scott Hensley is currently in the action stage of change. As such, his goal is to continue with weight loss efforts He has agreed to follow the Category 4 plan Scott Hensley has been instructed to work up to a goal of 150 minutes of combined cardio and strengthening exercise per week for weight loss and overall health benefits. We discussed the following Behavioral Modification Strategies today: keeping healthy foods in the home and work on meal  planning and easy cooking plans  Scott Hensley has agreed to follow up with our clinic in 3 weeks. He was informed of the importance of frequent follow up visits to maximize his success with intensive lifestyle modifications for his multiple health conditions.  ALLERGIES: No Known Allergies  MEDICATIONS: Current Outpatient Medications on File Prior  to Visit  Medication Sig Dispense Refill   atorvastatin (LIPITOR) 20 MG tablet TAKE 1 TABLET BY MOUTH EVERY DAY 90 tablet 0   glycopyrrolate (ROBINUL) 2 MG tablet Take 2 mg by mouth 2 (two) times daily as needed.     tadalafil (CIALIS) 5 MG tablet Take 1 tablet (5 mg total) by mouth daily as needed for erectile dysfunction (take 1 tablet daily OR take 2-4 tablets prn for erectile dysfunction.). 30 tablet 5   No current facility-administered medications on file prior to visit.     PAST MEDICAL HISTORY: Past Medical History:  Diagnosis Date   Constipation    Deviated septum    Diverticulosis 10/2010   Dr. Randa Evens, seen on CT   GERD (gastroesophageal reflux disease)    Hyperlipidemia    IBS (irritable bowel syndrome)    Dr. Randa Evens   Swallowing difficulty    Vitamin A deficiency    Vitamin D deficiency     PAST SURGICAL HISTORY: Past Surgical History:  Procedure Laterality Date   COLONOSCOPY  10/2010   Dr. Randa Evens   ESOPHAGOGASTRODUODENOSCOPY  12/03/07   Dr.Edwards-Small hiatal hernia. Mod-severe esophagitis   NASAL SEPTUM SURGERY  2007   WISDOM TOOTH EXTRACTION      SOCIAL HISTORY: Social History   Tobacco Use   Smoking status: Never Smoker   Smokeless tobacco: Never Used  Substance Use Topics   Alcohol use: Yes    Alcohol/week: 0.0 standard drinks    Comment: 6-10/week (when social, over summer)   Drug use: No    FAMILY HISTORY: Family History  Problem Relation Age of Onset   Cancer Mother        skin   Colon polyps Mother    Thyroid disease Mother        ?nodule (in 25's)   Colonic polyp Mother    Dementia Mother    Anxiety disorder Mother    Obesity Mother    Diabetes Father    Hyperlipidemia Father    Heart disease Father        CABG at 60   Urolithiasis Father    Ulcers Father    Cancer Father        skin   Sudden death Father    Sleep apnea Father    Obesity Father    Hyperlipidemia Brother    Cancer  Brother        skin   Cancer Maternal Grandmother        colon cancer (mid 26's)   Diabetes Paternal Uncle     ROS: Review of Systems  Constitutional: Positive for weight loss.  Cardiovascular: Negative for chest pain.  Gastrointestinal: Negative for nausea and vomiting.  Musculoskeletal:       Negative for muscle weakness    PHYSICAL EXAM: Blood pressure 104/69, pulse 69, temperature 97.7 F (36.5 C), temperature source Oral, height  (1.956 m), weight 255 lb (115.7 kg), SpO2 97 %. Body mass index is 30.24 kg/m. Physical Exam Vitals signs reviewed.  Constitutional:      Appearance: Normal appearance. He is well-developed. He is obese.  Cardiovascular:     Rate and Rhythm: Normal rate.  Pulmonary:     Effort: Pulmonary effort is normal.  Musculoskeletal: Normal range of motion.  Skin:    General: Skin is warm and dry.  Neurological:     Mental Status: He is alert and oriented to person, place, and time.  Psychiatric:        Mood and Affect: Mood normal.        Behavior: Behavior normal.     RECENT LABS AND TESTS: BMET    Component Value Date/Time   NA 143 04/29/2018 1015   K 4.8 04/29/2018 1015   CL 105 04/29/2018 1015   CO2 22 04/29/2018 1015   GLUCOSE 94 09/19/2018 1441   GLUCOSE 96 09/28/2014 0001   BUN 19 04/29/2018 1015   CREATININE 1.26 04/29/2018 1015   CREATININE 1.04 09/28/2014 0001   CALCIUM 9.8 04/29/2018 1015   GFRNONAA 68 04/29/2018 1015   GFRAA 79 04/29/2018 1015   Lab Results  Component Value Date   HGBA1C 5.5 11/18/2018   HGBA1C 5.4 04/29/2018   Lab Results  Component Value Date   INSULIN 11.9 11/18/2018   CBC    Component Value Date/Time   WBC 4.8 04/29/2018 1015   WBC 5.0 09/28/2014 0001   RBC 5.32 04/29/2018 1015   RBC 5.03 09/28/2014 0001   HGB 15.6 04/29/2018 1015   HCT 45.8 04/29/2018 1015   PLT 269 04/29/2018 1015   MCV 86 04/29/2018 1015   MCH 29.3 04/29/2018 1015   MCH 28.8 09/28/2014 0001   MCHC 34.1  04/29/2018 1015   MCHC 34.0 09/28/2014 0001   RDW 12.3 04/29/2018 1015   LYMPHSABS 1.4 04/29/2018 1015   MONOABS 0.4 09/28/2014 0001   EOSABS 0.1 04/29/2018 1015   BASOSABS 0.1 04/29/2018 1015   Iron/TIBC/Ferritin/ %Sat No results found for: IRON, TIBC, FERRITIN, IRONPCTSAT Lipid Panel     Component Value Date/Time   CHOL 161 06/28/2018 0835   TRIG 93 06/28/2018 0835   HDL 44 06/28/2018 0835   CHOLHDL 3.7 06/28/2018 0835   CHOLHDL 3.8 09/28/2014 0001   VLDL 15 09/28/2014 0001   LDLCALC 98 06/28/2018 0835   Hepatic Function Panel     Component Value Date/Time   PROT 7.7 09/19/2018 1441   ALBUMIN 5.2 (H) 09/19/2018 1441   AST 32 09/19/2018 1441   ALT 47 (H) 09/19/2018 1441   ALKPHOS 67 09/19/2018 1441   BILITOT 0.8 09/19/2018 1441   BILIDIR 0.27 09/19/2018 1441   IBILI 0.7 01/19/2012 0945      Component Value Date/Time   TSH 2.270 11/18/2018 1135   TSH 1.970 04/29/2018 1015   TSH 1.816 09/28/2014 0001     Ref. Range 09/19/2018 14:41  Vitamin D, 25-Hydroxy Latest Ref Range: 30.0 - 100.0 ng/mL 30.4    OBESITY BEHAVIORAL INTERVENTION VISIT  Today's visit was # 5   Starting weight: 284 lbs Starting date: 11/18/2018 Today's weight : 255 lbs Today's date: 01/29/2019 Total lbs lost to date: 29    01/29/2019  Height 6\' 5"  (1.956 m)  Weight 255 lb (115.7 kg)  BMI (Calculated) 30.23  BLOOD PRESSURE - SYSTOLIC 104  BLOOD PRESSURE - DIASTOLIC 69   Body Fat % 308 %  Total Body Water (lbs) 117 lbs    ASK: We discussed the diagnosis of obesity with today and Nash agreed to give Scott Hensley permission to discuss obesity behavioral modification therapy today.  ASSESS: Jeanne has the diagnosis of obesity and his BMI today is 30.23 Paulanthony is in the action  stage of change   ADVISE: Scott Hensley was educated on the multiple health risks of obesity as well as the benefit of weight loss to improve his health. He was advised of the need for long term treatment and the importance  of lifestyle modifications to improve his current health and to decrease his risk of future health problems.  AGREE: Multiple dietary modification options and treatment options were discussed and  Scott Hensley agreed to follow the recommendations documented in the above note.  ARRANGE: Scott Hensley was educated on the importance of frequent visits to treat obesity as outlined per CMS and USPSTF guidelines and agreed to schedule his next follow up appointment today.  Cristi LoronI, Joanne Murray, am acting as transcriptionist for Alois Clicheracey Aguilar, PA-C I, Alois Clicheracey Aguilar, PA-C have reviewed above note and agree with its content

## 2019-02-11 DIAGNOSIS — M25511 Pain in right shoulder: Secondary | ICD-10-CM | POA: Diagnosis not present

## 2019-02-11 DIAGNOSIS — X58XXXA Exposure to other specified factors, initial encounter: Secondary | ICD-10-CM | POA: Diagnosis not present

## 2019-02-11 DIAGNOSIS — S43431A Superior glenoid labrum lesion of right shoulder, initial encounter: Secondary | ICD-10-CM | POA: Diagnosis not present

## 2019-02-19 ENCOUNTER — Ambulatory Visit (INDEPENDENT_AMBULATORY_CARE_PROVIDER_SITE_OTHER): Payer: BC Managed Care – PPO | Admitting: Physician Assistant

## 2019-02-19 ENCOUNTER — Encounter (INDEPENDENT_AMBULATORY_CARE_PROVIDER_SITE_OTHER): Payer: Self-pay | Admitting: Physician Assistant

## 2019-02-19 ENCOUNTER — Other Ambulatory Visit: Payer: Self-pay

## 2019-02-19 VITALS — BP 99/63 | HR 67 | Temp 97.9°F | Ht 77.0 in | Wt 246.0 lb

## 2019-02-19 DIAGNOSIS — Z6830 Body mass index (BMI) 30.0-30.9, adult: Secondary | ICD-10-CM | POA: Diagnosis not present

## 2019-02-19 DIAGNOSIS — E7849 Other hyperlipidemia: Secondary | ICD-10-CM | POA: Diagnosis not present

## 2019-02-19 DIAGNOSIS — E669 Obesity, unspecified: Secondary | ICD-10-CM | POA: Diagnosis not present

## 2019-02-19 NOTE — Progress Notes (Signed)
Office: [phone]  /  Fax: [phone]   HPI:   Chief Complaint: OBESITY Scott Hensley is here to discuss his progress with his obesity treatment plan. He is on the Category 4 plan and is following his eating plan approximately 85 % of the time. He states he is doing cardio exercise 30 minutes 4 to 5 times per week. Scott Hensley reports that he had two birthday celebrations and one work celebration with his brother, but he was able to portion control those sweet treats. His weight is 246 lb (111.6 kg) today and has had a weight loss of 9 pounds over a period of 3 weeks since his last visit. He has lost 38 lbs since starting treatment with [place].  Hyperlipidemia Scott Hensley has hyperlipidemia and he is on Atorvastatin. He has been trying to improve his cholesterol levels with intensive lifestyle modification including a low saturated fat diet, exercise and weight loss. He denies any chest pain.  ASSESSMENT AND PLAN:  Other hyperlipidemia  Class 1 obesity with serious comorbidity and body mass index (BMI) of 30.0 to 30.9 in adult, unspecified obesity type - Starting BMI greater then 30  PLAN:  Hyperlipidemia Scott Hensley was informed of the American Heart Association Guidelines emphasizing intensive lifestyle modifications as the first line treatment for hyperlipidemia. We discussed many lifestyle modifications today in depth, and Scott Hensley will continue to work on decreasing saturated fats such as fatty red meat, butter and many fried foods. He will also increase vegetables and lean protein in his diet and continue to work on exercise and weight loss efforts. Scott Hensley will continue Atorvastatin and follow up as directed.  I spent > than 50% of the 15 minute visit on counseling as documented in the note.  Obesity Scott Hensley is currently in the action stage of change. As such, his goal is to continue with weight loss efforts He has agreed to follow the Category 4 plan Scott Hensley has been instructed to work up to a goal of 150 minutes of  combined cardio and strengthening exercise per week for weight loss and overall health benefits. We discussed the following Behavioral Modification Strategies today: holiday eating strategies and celebration eating strategies  Scott Hensley has agreed to follow up with our clinic in 3 weeks. He was informed of the importance of frequent follow up visits to maximize his success with intensive lifestyle modifications for his multiple health conditions.  ALLERGIES: No Known Allergies  MEDICATIONS: Current Outpatient Medications on File Prior to Visit  Medication Sig Dispense Refill   atorvastatin (LIPITOR) 20 MG tablet TAKE 1 TABLET BY MOUTH EVERY DAY 90 tablet 0   glycopyrrolate (ROBINUL) 2 MG tablet Take 2 mg by mouth 2 (two) times daily as needed.     tadalafil (CIALIS) 5 MG tablet Take 1 tablet (5 mg total) by mouth daily as needed for erectile dysfunction (take 1 tablet daily OR take 2-4 tablets prn for erectile dysfunction.). 30 tablet 5   Vitamin D, Ergocalciferol, (DRISDOL) 1.25 MG (50000 UT) CAPS capsule Take 1 capsule (50,000 Units total) by mouth every 7 (seven) days. 4 capsule 0   No current facility-administered medications on file prior to visit.     PAST MEDICAL HISTORY: Past Medical History:  Diagnosis Date   Constipation    Deviated septum    Diverticulosis 10/2010   Dr. Randa Evens, seen on CT   GERD (gastroesophageal reflux disease)    Hyperlipidemia    IBS (irritable bowel syndrome)    Dr. Randa Evens   Swallowing difficulty  Vitamin A deficiency    Vitamin D deficiency     PAST SURGICAL HISTORY: Past Surgical History:  Procedure Laterality Date   COLONOSCOPY  10/2010   Dr. Randa EvensEdwards   ESOPHAGOGASTRODUODENOSCOPY  12/03/07   Dr.Edwards-Small hiatal hernia. Mod-severe esophagitis   NASAL SEPTUM SURGERY  2007   WISDOM TOOTH EXTRACTION      SOCIAL HISTORY: Social History   Tobacco Use   Smoking status: Never Smoker   Smokeless tobacco: Never Used    Substance Use Topics   Alcohol use: Yes    Alcohol/week: 0.0 standard drinks    Comment: 6-10/week (when social, over summer)   Drug use: No    FAMILY HISTORY: Family History  Problem Relation Age of Onset   Cancer Mother        skin   Colon polyps Mother    Thyroid disease Mother        ?nodule (in 5630's)   Colonic polyp Mother    Dementia Mother    Anxiety disorder Mother    Obesity Mother    Diabetes Father    Hyperlipidemia Father    Heart disease Father        CABG at 9472   Urolithiasis Father    Ulcers Father    Cancer Father        skin   Sudden death Father    Sleep apnea Father    Obesity Father    Hyperlipidemia Brother    Cancer Brother        skin   Cancer Maternal Grandmother        colon cancer (mid 2380's)   Diabetes Paternal Uncle     ROS: Review of Systems  Constitutional: Positive for weight loss.  Cardiovascular: Negative for chest pain.    PHYSICAL EXAM: Blood pressure 99/63, pulse 67, temperature 97.9 F (36.6 C), temperature source Oral, height 6\' 5"  (1.956 m), weight 246 lb (111.6 kg), SpO2 98 %. Body mass index is 29.17 kg/m. Physical Exam Vitals signs reviewed.  Constitutional:      Appearance: Normal appearance. He is well-developed. He is obese.  Cardiovascular:     Rate and Rhythm: Normal rate.  Pulmonary:     Effort: Pulmonary effort is normal.  Musculoskeletal: Normal range of motion.  Skin:    General: Skin is warm and dry.  Neurological:     Mental Status: He is alert and oriented to person, place, and time.  Psychiatric:        Mood and Affect: Mood normal.        Behavior: Behavior normal.     RECENT LABS AND TESTS: BMET    Component Value Date/Time   NA 143 04/29/2018 1015   K 4.8 04/29/2018 1015   CL 105 04/29/2018 1015   CO2 22 04/29/2018 1015   GLUCOSE 94 09/19/2018 1441   GLUCOSE 96 09/28/2014 0001   BUN 19 04/29/2018 1015   CREATININE 1.26 04/29/2018 1015   CREATININE 1.04  09/28/2014 0001   CALCIUM 9.8 04/29/2018 1015   GFRNONAA 68 04/29/2018 1015   GFRAA 79 04/29/2018 1015   Lab Results  Component Value Date   HGBA1C 5.5 11/18/2018   HGBA1C 5.4 04/29/2018   Lab Results  Component Value Date   INSULIN 11.9 11/18/2018   CBC    Component Value Date/Time   WBC 4.8 04/29/2018 1015   WBC 5.0 09/28/2014 0001   RBC 5.32 04/29/2018 1015   RBC 5.03 09/28/2014 0001   HGB 15.6 04/29/2018 1015  HCT 45.8 04/29/2018 1015   PLT 269 04/29/2018 1015   MCV 86 04/29/2018 1015   MCH 29.3 04/29/2018 1015   MCH 28.8 09/28/2014 0001   MCHC 34.1 04/29/2018 1015   MCHC 34.0 09/28/2014 0001   RDW 12.3 04/29/2018 1015   LYMPHSABS 1.4 04/29/2018 1015   MONOABS 0.4 09/28/2014 0001   EOSABS 0.1 04/29/2018 1015   BASOSABS 0.1 04/29/2018 1015   Iron/TIBC/Ferritin/ %Sat No results found for: IRON, TIBC, FERRITIN, IRONPCTSAT Lipid Panel     Component Value Date/Time   CHOL 161 06/28/2018 0835   TRIG 93 06/28/2018 0835   HDL 44 06/28/2018 0835   CHOLHDL 3.7 06/28/2018 0835   CHOLHDL 3.8 09/28/2014 0001   VLDL 15 09/28/2014 0001   LDLCALC 98 06/28/2018 0835   Hepatic Function Panel     Component Value Date/Time   PROT 7.7 09/19/2018 1441   ALBUMIN 5.2 (H) 09/19/2018 1441   AST 32 09/19/2018 1441   ALT 47 (H) 09/19/2018 1441   ALKPHOS 67 09/19/2018 1441   BILITOT 0.8 09/19/2018 1441   BILIDIR 0.27 09/19/2018 1441   IBILI 0.7 01/19/2012 0945      Component Value Date/Time   TSH 2.270 11/18/2018 1135   TSH 1.970 04/29/2018 1015   TSH 1.816 09/28/2014 0001     Ref. Range 09/19/2018 14:41  Vitamin D, 25-Hydroxy Latest Ref Range: 30.0 - 100.0 ng/mL 30.4    OBESITY BEHAVIORAL INTERVENTION VISIT  Today's visit was # 6   Starting weight: 284 lbs Starting date: 11/18/2018 Today's weight : 246 lbs Today's date: 02/19/2019 Total lbs lost to date: 38    02/19/2019  Height 6\' 5"  (1.956 m)  Weight 246 lb (111.6 kg)  BMI (Calculated) 29.17  BLOOD  PRESSURE - SYSTOLIC 99  BLOOD PRESSURE - DIASTOLIC 63   Body Fat % 27 %  Total Body Water (lbs) 114.6 lbs    ASK: We discussed the diagnosis of obesity with Scott Hensley today and Scott Hensley agreed to give [place] permission to discuss obesity behavioral modification therapy today.  ASSESS: Scott Hensley has the diagnosis of obesity and his BMI today is 29.17 Scott Hensley is in the action stage of change   ADVISE: Scott Hensley was educated on the multiple health risks of obesity as well as the benefit of weight loss to improve his health. He was advised of the need for long term treatment and the importance of lifestyle modifications to improve his current health and to decrease his risk of future health problems.  AGREE: Multiple dietary modification options and treatment options were discussed and  Scott Hensley agreed to follow the recommendations documented in the above note.  ARRANGE: Scott Hensley was educated on the importance of frequent visits to treat obesity as outlined per CMS and USPSTF guidelines and agreed to schedule his next follow up appointment today.  Scott Hensley, am acting as transcriptionist for Scott Potash, PA-C I, Scott Potash, PA-C have reviewed above note and agree with its content

## 2019-03-19 ENCOUNTER — Ambulatory Visit (INDEPENDENT_AMBULATORY_CARE_PROVIDER_SITE_OTHER): Payer: BC Managed Care – PPO | Admitting: Physician Assistant

## 2019-03-19 ENCOUNTER — Telehealth: Payer: Self-pay | Admitting: *Deleted

## 2019-03-19 ENCOUNTER — Other Ambulatory Visit: Payer: Self-pay

## 2019-03-19 ENCOUNTER — Encounter (INDEPENDENT_AMBULATORY_CARE_PROVIDER_SITE_OTHER): Payer: Self-pay | Admitting: Physician Assistant

## 2019-03-19 VITALS — BP 92/55 | HR 62 | Temp 98.7°F | Ht 77.0 in | Wt 240.0 lb

## 2019-03-19 DIAGNOSIS — Z9189 Other specified personal risk factors, not elsewhere classified: Secondary | ICD-10-CM

## 2019-03-19 DIAGNOSIS — E559 Vitamin D deficiency, unspecified: Secondary | ICD-10-CM | POA: Diagnosis not present

## 2019-03-19 DIAGNOSIS — E8881 Metabolic syndrome: Secondary | ICD-10-CM | POA: Diagnosis not present

## 2019-03-19 DIAGNOSIS — E669 Obesity, unspecified: Secondary | ICD-10-CM

## 2019-03-19 DIAGNOSIS — Z6830 Body mass index (BMI) 30.0-30.9, adult: Secondary | ICD-10-CM

## 2019-03-19 MED ORDER — VITAMIN D (ERGOCALCIFEROL) 1.25 MG (50000 UNIT) PO CAPS: 50000.0000 [IU] | ORAL_CAPSULE | ORAL | 0 refills | Status: DC

## 2019-03-19 NOTE — Telephone Encounter (Signed)
error 

## 2019-03-19 NOTE — Progress Notes (Signed)
Office: [phone]  /  Fax: [phone]   HPI:   Chief Complaint: OBESITY Scott Hensley is here to discuss his progress with his obesity treatment plan. He is on the Category 4 plan and is following his eating plan approximately 95 % of the time. He states he is doing cardio exercise 45 minutes 4 times per week. Scott Hensley states that he did well over Thanksgiving. [place] next week. He has started doing some strength training. His weight is 240 lb (108.9 kg) today and has had a weight loss of 6 pounds over a period of 4 weeks since his last visit. He has lost 44 lbs since starting treatment with us.  Vitamin D deficiency Scott Hensley has a diagnosis of vitamin D deficiency. Scott Hensley is on vit D and he denies nausea, vomiting or muscle weakness.  Insulin Resistance Scott Hensley has a diagnosis of insulin resistance based on his elevated fasting insulin level >5. Although Scott Hensley's blood glucose readings are still under good control, insulin resistance puts him at greater risk of metabolic syndrome and diabetes. Scott Hensley is not on medications and he has no polyphagia. He reports that he is able to resist cravings most of the time. Scott Hensley continues to work on diet and exercise to decrease risk of diabetes.  At risk for diabetes Scott Hensley is at higher than average risk for developing diabetes due to his obesity and insulin resistance.   ASSESSMENT AND PLAN:  Vitamin D deficiency - Plan: Vitamin D, Ergocalciferol, (DRISDOL) 1.25 MG (50000 UT) CAPS capsule  Insulin resistance  At risk for diabetes mellitus  Class 1 obesity with serious comorbidity and body mass index (BMI) of 30.0 to 30.9 in adult, unspecified obesity type - Starting BMI greater then 30  PLAN:  Vitamin D Deficiency Low vitamin D level contributes to fatigue and are associated with obesity, breast, and colon cancer. Scott Hensley agrees to continue to take prescription Vit D @50 ,000 IU every week #4 with no refills and he  will follow up for routine testing of vitamin D, at least 2-3 times per year to avoid over-replacement. Scott Hensley agrees to follow up with our clinic in 3 to 4 weeks.  Insulin Resistance Scott Hensley will continue to work on weight loss, exercise, and decreasing simple carbohydrates to help decrease the risk of diabetes. Scott Hensley agreed to follow up with us as directed to closely monitor his progress.  Diabetes risk counseling (~15 min) Scott Hensley is a 48 y.o. male and has risk factors for diabetes including obesity and insulin resistance. We discussed intensive lifestyle modifications today with an emphasis on weight loss as well as increasing exercise and decreasing simple carbohydrates in his diet.  Obesity Scott Hensley is currently in the action stage of change. As such, his goal is to continue with weight loss efforts He has agreed to follow the Category 4 plan Scott Hensley has been instructed to work up to a goal of 150 minutes of combined cardio and strengthening exercise per week for weight loss and overall health benefits. We discussed the following Behavioral Modification Strategies today: keeping healthy foods in the home, travel eating strategies and holiday eating strategies   Scott Hensley has agreed to follow up with our clinic in 3 to 4 weeks. He was informed of the importance of frequent follow up visits to maximize his success with intensive lifestyle modifications for his multiple health conditions.  ALLERGIES: No Known Allergies  MEDICATIONS: Current Outpatient Medications on File Prior to Visit  Medication Sig Dispense Refill  atorvastatin (LIPITOR) 20 MG tablet TAKE 1 TABLET BY MOUTH EVERY DAY 90 tablet 0   glycopyrrolate (ROBINUL) 2 MG tablet Take 2 mg by mouth 2 (two) times daily as needed.     tadalafil (CIALIS) 5 MG tablet Take 1 tablet (5 mg total) by mouth daily as needed for erectile dysfunction (take 1 tablet daily OR take 2-4 tablets prn for erectile dysfunction.). 30 tablet 5   No current  facility-administered medications on file prior to visit.     PAST MEDICAL HISTORY: Past Medical History:  Diagnosis Date   Constipation    Deviated septum    Diverticulosis 10/2010   Dr. Randa Evens, seen on CT   GERD (gastroesophageal reflux disease)    Hyperlipidemia    IBS (irritable bowel syndrome)    Dr. Randa Evens   Swallowing difficulty    Vitamin A deficiency    Vitamin D deficiency     PAST SURGICAL HISTORY: Past Surgical History:  Procedure Laterality Date   COLONOSCOPY  10/2010   Dr. Randa Evens   ESOPHAGOGASTRODUODENOSCOPY  12/03/07   Dr.Edwards-Small hiatal hernia. Mod-severe esophagitis   NASAL SEPTUM SURGERY  2007   WISDOM TOOTH EXTRACTION      SOCIAL HISTORY: Social History   Tobacco Use   Smoking status: Never Smoker   Smokeless tobacco: Never Used  Substance Use Topics   Alcohol use: Yes    Alcohol/week: 0.0 standard drinks    Comment: 6-10/week (when social, over summer)   Drug use: No    FAMILY HISTORY: Family History  Problem Relation Age of Onset   Cancer Mother        skin   Colon polyps Mother    Thyroid disease Mother        ?nodule (in 95's)   Colonic polyp Mother    Dementia Mother    Anxiety disorder Mother    Obesity Mother    Diabetes Father    Hyperlipidemia Father    Heart disease Father        CABG at 61   Urolithiasis Father    Ulcers Father    Cancer Father        skin   Sudden death Father    Sleep apnea Father    Obesity Father    Hyperlipidemia Brother    Cancer Brother        skin   Cancer Maternal Grandmother        colon cancer (mid 67's)   Diabetes Paternal Uncle     ROS: Review of Systems  Constitutional: Positive for weight loss.  Gastrointestinal: Negative for nausea and vomiting.  Musculoskeletal:       Negative for muscle weakness  Endo/Heme/Allergies:       Negative for polyphagia Positive for cravings    PHYSICAL EXAM: Blood pressure (!) 92/55, pulse 62,  temperature 98.7 F (37.1 C), temperature source Oral, height 6\' 5"  (1.956 m), weight 240 lb (108.9 kg), SpO2 100 %. Body mass index is 28.46 kg/m. Physical Exam Vitals signs reviewed.  Constitutional:      Appearance: Normal appearance. He is well-developed. He is obese.  Cardiovascular:     Rate and Rhythm: Normal rate.  Pulmonary:     Effort: Pulmonary effort is normal.  Musculoskeletal: Normal range of motion.  Skin:    General: Skin is warm and dry.  Neurological:     Mental Status: He is alert and oriented to person, place, and time.  Psychiatric:        Mood  and Affect: Mood normal.        Behavior: Behavior normal.     RECENT LABS AND TESTS: BMET    Component Value Date/Time   NA 143 04/29/2018 1015   K 4.8 04/29/2018 1015   CL 105 04/29/2018 1015   CO2 22 04/29/2018 1015   GLUCOSE 94 09/19/2018 1441   GLUCOSE 96 09/28/2014 0001   BUN 19 04/29/2018 1015   CREATININE 1.26 04/29/2018 1015   CREATININE 1.04 09/28/2014 0001   CALCIUM 9.8 04/29/2018 1015   GFRNONAA 68 04/29/2018 1015   GFRAA 79 04/29/2018 1015   Lab Results  Component Value Date   HGBA1C 5.5 11/18/2018   HGBA1C 5.4 04/29/2018   Lab Results  Component Value Date   INSULIN 11.9 11/18/2018   CBC    Component Value Date/Time   WBC 4.8 04/29/2018 1015   WBC 5.0 09/28/2014 0001   RBC 5.32 04/29/2018 1015   RBC 5.03 09/28/2014 0001   HGB 15.6 04/29/2018 1015   HCT 45.8 04/29/2018 1015   PLT 269 04/29/2018 1015   MCV 86 04/29/2018 1015   MCH 29.3 04/29/2018 1015   MCH 28.8 09/28/2014 0001   MCHC 34.1 04/29/2018 1015   MCHC 34.0 09/28/2014 0001   RDW 12.3 04/29/2018 1015   LYMPHSABS 1.4 04/29/2018 1015   MONOABS 0.4 09/28/2014 0001   EOSABS 0.1 04/29/2018 1015   BASOSABS 0.1 04/29/2018 1015   Iron/TIBC/Ferritin/ %Sat No results found for: IRON, TIBC, FERRITIN, IRONPCTSAT Lipid Panel     Component Value Date/Time   CHOL 161 06/28/2018 0835   TRIG 93 06/28/2018 0835   HDL 44  06/28/2018 0835   CHOLHDL 3.7 06/28/2018 0835   CHOLHDL 3.8 09/28/2014 0001   VLDL 15 09/28/2014 0001   LDLCALC 98 06/28/2018 0835   Hepatic Function Panel     Component Value Date/Time   PROT 7.7 09/19/2018 1441   ALBUMIN 5.2 (H) 09/19/2018 1441   AST 32 09/19/2018 1441   ALT 47 (H) 09/19/2018 1441   ALKPHOS 67 09/19/2018 1441   BILITOT 0.8 09/19/2018 1441   BILIDIR 0.27 09/19/2018 1441   IBILI 0.7 01/19/2012 0945      Component Value Date/Time   TSH 2.270 11/18/2018 1135   TSH 1.970 04/29/2018 1015   TSH 1.816 09/28/2014 0001     Ref. Range 09/19/2018 14:41  Vitamin D, 25-Hydroxy Latest Ref Range: 30.0 - 100.0 ng/mL 30.4    OBESITY BEHAVIORAL INTERVENTION VISIT  Today's visit was # 7   Starting weight: 284 lbs Starting date: 11/18/2018 Today's weight : 240 lbs Today's date: 03/19/2019 Total lbs lost to date: 44    03/19/2019  Height  (1.956 m)  Weight 240 lb (108.9 kg)  BMI (Calculated) 28.45  BLOOD PRESSURE - SYSTOLIC 92  BLOOD PRESSURE - DIASTOLIC 55   Body Fat % 25.6 %  Total Body Water (lbs) 112.4 lbs    ASK: We discussed the diagnosis of obesity with Unice Bailey today and Gerardo agreed to give [place] permission to discuss obesity behavioral modification therapy today.  ASSESS: Tysen has the diagnosis of obesity and his BMI today is 28.45 Taegen is in the action stage of change   ADVISE: Calven was educated on the multiple health risks of obesity as well as the benefit of weight loss to improve his health. He was advised of the need for long term treatment and the importance of lifestyle modifications to improve his current health and to decrease his risk of  future health problems.  AGREE: Multiple dietary modification options and treatment options were discussed and  Bayron agreed to follow the recommendations documented in the above note.  ARRANGE: Sheridan was educated on the importance of frequent visits to treat obesity as outlined per CMS and USPSTF  guidelines and agreed to schedule his next follow up appointment today.  Corey Skains, am acting as transcriptionist for Abby Potash, PA-C I, Abby Potash, PA-C have reviewed above note and agree with its content

## 2019-03-23 NOTE — Progress Notes (Signed)
Chief Complaint  Patient presents with  . Hyperlipidemia    patient is nonfasting for today's med check. No concerns.     Patient presents for 6 month follow-up.  Since his last visit here he has been seen at the [facility], and has lost 44# with them so far.  Hyperlipidemia:  He is compliant with taking atorvastatin, tolerates without side effects. He is following a lowfat, low cholesterol diet.  Lab Results  Component Value Date   CHOL 161 06/28/2018   HDL 44 06/28/2018   LDLCALC 98 06/28/2018   TRIG 93 06/28/2018   CHOLHDL 3.7 06/28/2018   Vitamin D deficiency:  Vitamin D-OH level was low at 9.9 in 04/2018.  He was prescribed 12 weeks of 50,000 IU qwk. He completed this prescription, but didn't take supplement after. Recheck in June was 30.4. He is currently taking 50,000 IU once a week through [facility] clinic. He reports that he has been taking the rx for 3 months (had a delay in starting it), and hasn't taken any for the last 2 weeks, refill is at the pharmacy. Level hasn't yet been rechecked.   He thinks he is due for fasting labs with his next visit.  At his CPE in June he had noticed some ED (difficulty maintaining erections).  He was prescribed Cialis. (originally written for 20mg  tablet; per chart,, needed prior auth, and had to change to 5mg  for cost purposes, instructed to take 1 daily vs 2-4 tabs prn.) He recalls taking 1/2 tablet once, and it was effective, hasn't needed since. Only got 4 tablets (did NOT get #30, so likely filled the 20mg , and not the 5mg ).  At that time he also mentioned some urinary hesitancy, stream isn't continuous (stops and then voids some more).  Symptoms had been ongoing for 1 year, not acutely worse.  He now reports that it resolved without having to see the urologist. No dysuria, hematuria. (He had seen urologist in the past for similar symptoms (which had resolved), and was advised to follow-up with them, but it resolved on its own.)  He sees GI for  ongoing abdominal and back complaints, with constipation. He reports doing much better since taking metamucil and flax seed daily.  Bowels are more regular, gets discomfort (back, abd) much less often. Due for colonoscopy (Dr. Oletta Lamas is retiring, will schedule with another partner).  PMH, PSH, SH were reviewed and updated  Outpatient Encounter Medications as of 03/24/2019  Medication Sig  . atorvastatin (LIPITOR) 20 MG tablet TAKE 1 TABLET BY MOUTH EVERY DAY  . Vitamin D, Ergocalciferol, (DRISDOL) 1.25 MG (50000 UT) CAPS capsule Take 1 capsule (50,000 Units total) by mouth every 7 (seven) days.  . [DISCONTINUED] atorvastatin (LIPITOR) 20 MG tablet Take by mouth.  . tadalafil (CIALIS) 5 MG tablet Take 1 tablet (5 mg total) by mouth daily as needed for erectile dysfunction (take 1 tablet daily OR take 2-4 tablets prn for erectile dysfunction.). (Patient not taking: Reported on 03/24/2019)  . [DISCONTINUED] glycopyrrolate (ROBINUL) 2 MG tablet Take 2 mg by mouth 2 (two) times daily as needed.   No facility-administered encounter medications on file as of 03/24/2019.   No Known Allergies  ROS: no fever, chills, URI symptoms, headaches, chest pain, shortness of breath.  Abdominal and back discomfort has improved.  No further ED. Urinary complaints resolved.   PHYSICAL EXAM:  BP (!) 100/58   Pulse 60   Temp (!) 97.5 F (36.4 C) (Tympanic)   Ht 6\' 5"  (  1.956 m)   Wt 249 lb 12.8 oz (113.3 kg)   BMI 29.62 kg/m   Wt Readings from Last 3 Encounters:  03/24/19 249 lb 12.8 oz (113.3 kg)  03/19/19 240 lb (108.9 kg)  02/19/19 246 lb (111.6 kg)   285# at his CPE in 09/2018  Well appearing, pleasant male in no distress HEENT: conjunctiva and sclera are clear, anicteric. Wearing mask due to COVID-19 pandemic Neck: no lymphadenopathy, thyromegaly or mass. Heart: regular rate and rhythm, no murmur Lungs: clear bilaterally Back: no spinal or CVA tenderness Abdomen: soft, nontender, no  organomegaly or mass Extremities: no edema, normal pulses. Psych: normal mood, affect, hygiene and grooming Neuro: alert and oriented, normal gait.   ASSESSMENT/PLAN:  Pure hypercholesterolemia - at goal on statin per last check. Not fasting today.  Due for LFT's--prefers to wait and get labs with [facility]  Vitamin D deficiency - due for labs--declines today, will wait to get with [facility] clinic. Will pick up refill from pharm  Overweight (BMI 25.0-29.9) - congratulated on his wt loss so far. f/u as planned with [facility] clinic.  Erectile dysfunction, unspecified erectile dysfunction type - Resolved. Has meds on hand if needed, (plus 5mg  dose at pharmacy)   Declines labs today, prefers to wait and have done at [facility].  Sent [facility] to [facility] (that he will be fasting for next visit, unsure which labs planned, but to please do LFT's, consider lipids, in addition to D).

## 2019-03-24 ENCOUNTER — Encounter: Payer: Self-pay | Admitting: Family Medicine

## 2019-03-24 ENCOUNTER — Other Ambulatory Visit: Payer: Self-pay

## 2019-03-24 ENCOUNTER — Ambulatory Visit (INDEPENDENT_AMBULATORY_CARE_PROVIDER_SITE_OTHER): Payer: BC Managed Care – PPO | Admitting: Family Medicine

## 2019-03-24 VITALS — BP 100/58 | HR 60 | Temp 97.5°F | Ht 77.0 in | Wt 249.8 lb

## 2019-03-24 DIAGNOSIS — Z5181 Encounter for therapeutic drug level monitoring: Secondary | ICD-10-CM

## 2019-03-24 DIAGNOSIS — E663 Overweight: Secondary | ICD-10-CM

## 2019-03-24 DIAGNOSIS — N529 Male erectile dysfunction, unspecified: Secondary | ICD-10-CM

## 2019-03-24 DIAGNOSIS — E559 Vitamin D deficiency, unspecified: Secondary | ICD-10-CM | POA: Diagnosis not present

## 2019-03-24 DIAGNOSIS — E7800 Pure hypercholesterolemia, unspecified: Secondary | ICD-10-CM | POA: Diagnosis not present

## 2019-03-25 ENCOUNTER — Encounter: Payer: Self-pay | Admitting: Family Medicine

## 2019-03-31 ENCOUNTER — Other Ambulatory Visit: Payer: Self-pay | Admitting: Family Medicine

## 2019-03-31 DIAGNOSIS — E7800 Pure hypercholesterolemia, unspecified: Secondary | ICD-10-CM

## 2019-04-13 ENCOUNTER — Other Ambulatory Visit (INDEPENDENT_AMBULATORY_CARE_PROVIDER_SITE_OTHER): Payer: Self-pay | Admitting: Physician Assistant

## 2019-04-13 DIAGNOSIS — E559 Vitamin D deficiency, unspecified: Secondary | ICD-10-CM

## 2019-04-16 ENCOUNTER — Other Ambulatory Visit: Payer: Self-pay

## 2019-04-16 ENCOUNTER — Encounter (INDEPENDENT_AMBULATORY_CARE_PROVIDER_SITE_OTHER): Payer: Self-pay | Admitting: Physician Assistant

## 2019-04-16 ENCOUNTER — Ambulatory Visit (INDEPENDENT_AMBULATORY_CARE_PROVIDER_SITE_OTHER): Payer: BC Managed Care – PPO | Admitting: Physician Assistant

## 2019-04-16 VITALS — BP 95/61 | HR 58 | Temp 97.8°F | Ht 77.0 in | Wt 240.0 lb

## 2019-04-16 DIAGNOSIS — E7849 Other hyperlipidemia: Secondary | ICD-10-CM

## 2019-04-16 DIAGNOSIS — Z6830 Body mass index (BMI) 30.0-30.9, adult: Secondary | ICD-10-CM

## 2019-04-16 DIAGNOSIS — E8881 Metabolic syndrome: Secondary | ICD-10-CM

## 2019-04-16 DIAGNOSIS — E66811 Obesity, class 1: Secondary | ICD-10-CM

## 2019-04-16 DIAGNOSIS — Z9189 Other specified personal risk factors, not elsewhere classified: Secondary | ICD-10-CM | POA: Diagnosis not present

## 2019-04-16 DIAGNOSIS — E559 Vitamin D deficiency, unspecified: Secondary | ICD-10-CM | POA: Diagnosis not present

## 2019-04-16 DIAGNOSIS — E88819 Insulin resistance, unspecified: Secondary | ICD-10-CM

## 2019-04-16 DIAGNOSIS — E669 Obesity, unspecified: Secondary | ICD-10-CM

## 2019-04-16 MED ORDER — VITAMIN D (ERGOCALCIFEROL) 1.25 MG (50000 UNIT) PO CAPS: 50000.0000 [IU] | ORAL_CAPSULE | ORAL | 0 refills | Status: DC

## 2019-04-17 LAB — COMPREHENSIVE METABOLIC PANEL
ALT: 41 IU/L (ref 0–44)
AST: 40 IU/L (ref 0–40)
Albumin/Globulin Ratio: 2 (ref 1.2–2.2)
Albumin: 4.6 g/dL (ref 4.0–5.0)
Alkaline Phosphatase: 88 IU/L (ref 39–117)
BUN/Creatinine Ratio: 16 (ref 9–20)
BUN: 19 mg/dL (ref 6–24)
Bilirubin Total: 0.6 mg/dL (ref 0.0–1.2)
CO2: 23 mmol/L (ref 20–29)
Calcium: 9.7 mg/dL (ref 8.7–10.2)
Chloride: 104 mmol/L (ref 96–106)
Creatinine, Ser: 1.19 mg/dL (ref 0.76–1.27)
GFR calc Af Amer: 84 mL/min/{1.73_m2} (ref >59)
GFR calc non Af Amer: 73 mL/min/{1.73_m2} (ref >59)
Globulin, Total: 2.3 g/dL (ref 1.5–4.5)
Glucose: 92 mg/dL (ref 65–99)
Potassium: 4.5 mmol/L (ref 3.5–5.2)
Sodium: 141 mmol/L (ref 134–144)
Total Protein: 6.9 g/dL (ref 6.0–8.5)

## 2019-04-17 LAB — VITAMIN D 25 HYDROXY (VIT D DEFICIENCY, FRACTURES): Vit D, 25-Hydroxy: 44.5 ng/mL (ref 30.0–100.0)

## 2019-04-17 LAB — HEMOGLOBIN A1C
Est. average glucose Bld gHb Est-mCnc: 114 mg/dL
Hgb A1c MFr Bld: 5.6 % (ref 4.8–5.6)

## 2019-04-17 LAB — LIPID PANEL WITH LDL/HDL RATIO
Cholesterol, Total: 191 mg/dL (ref 100–199)
HDL: 50 mg/dL (ref >39)
LDL Chol Calc (NIH): 128 mg/dL — ABNORMAL HIGH (ref 0–99)
LDL/HDL Ratio: 2.6 ratio (ref 0.0–3.6)
Triglycerides: 69 mg/dL (ref 0–149)
VLDL Cholesterol Cal: 13 mg/dL (ref 5–40)

## 2019-04-17 LAB — INSULIN, RANDOM: INSULIN: 6.6 u[IU]/mL (ref 2.6–24.9)

## 2019-04-21 NOTE — Progress Notes (Signed)
Chief Complaint:   OBESITY Scott Hensley. Scott Hensley is on the Category 4 Plan and states he is following his eating plan approximately 80% of the time. Scott Hensley states he is doing resistance and cardio exercise 35 minutes 5 times per week.  Today's visit was #: 8 Starting weight: 284 lbs Starting date: 11/18/2018 Today's weight: 240 lbs Today's date: 04/16/2019 Total lbs lost to date: 44 Total lbs lost since last in-office visit: 0  Interim History: Scott Hensley had an anniversary trip, where he felt he gained 3 pounds. He states he's probably lost the weight that he gained. He has also been eating some sauces with his dinner and thinks that he may need to cut these out.  Subjective:   Vitamin D deficiency Scott Hensley has no nausea, vomiting or muscle weakness on vitamin D. He has no excessive fatigue. His last level was at 30.4 (09/19/18). He is due for labs.  Hyperlipidemia (chronic) Scott Hensley is on Atorvastatin. He has no chest pain or myalgias. His last lipid panel was in the normal range. He thinks he has familial hypercholesterolemia. Labs are due.  Insulin Resistance Scott Hensley is not on medications. He has no polyphagia. His last A1c was at 5.5 and his last insulin level was at 11.9 (11/18/18). Labs are due. Labs were discussed with patient today.  At risk for cardiovascular disease Scott Hensley is at a higher than average risk for cardiovascular disease due to obesity, hyperlipidemia and insulin resistance. Reviewed: no chest pain on exertion, no dyspnea on exertion, and no swelling of ankles.  Assessment/Plan:   Vitamin D deficiency Low Vitamin D level contributes to fatigue and are associated with obesity, breast, and colon cancer. Scott Hensley agrees to continue to take prescription Vitamin D @50 ,000 IU every week #4 with no refills and he will follow-up for routine testing of vitamin D, at least 2-3  times per year to avoid over-replacement. We will check labs and Scott Hensley agrees to follow up as directed.  Hyperlipidemia (chronic) Cardiovascular risk and specific lipid/LDL goals reviewed.  We discussed several lifestyle modifications today and Scott Hensley will continue with medications. He will continue to work on diet, exercise and weight loss efforts. We will check labs today. Orders and follow up as documented in patient record.   Counseling Intensive lifestyle modifications are the first line treatment for this issue. . Dietary changes: Increase soluble fiber. Decrease simple carbohydrates. . Exercise changes: Moderate to vigorous-intensity aerobic activity 150 minutes per week if tolerated. . Lipid-lowering medications: see documented in medical record. .  Insulin Resistance Scott Hensley will continue to work on weight loss, exercise, and decreasing simple carbohydrates to help decrease the risk of diabetes. We will check labs and Scott Hensley agreed to follow-up with Scott Hensley as directed to closely monitor his progress.  At risk for cardiovascular disease Scott Hensley was given approximately 15 minutes of coronary artery disease prevention counseling today. He is 49 y.o. male and has risk factors for heart disease including obesity, hyperlipidemia and insulin resistance. We discussed intensive lifestyle modifications today with an emphasis on specific weight loss instructions and strategies.   Scott Hensley is currently in the action stage of change. As such, his goal is to continue with weight loss efforts. He has agreed to on the Category 4 Plan.   We discussed the following exercise goals today: For substantial health benefits, adults should do at least 150 minutes (2 hours  and 30 minutes) a week of moderate-intensity, or 75 minutes (1 hour and 15 minutes) a week of vigorous-intensity aerobic physical activity, or an equivalent combination of moderate- and vigorous-intensity aerobic activity. Aerobic activity should be performed in  episodes of at least 10 minutes, and preferably, it should be spread throughout the week. Adults should also include muscle-strengthening activities that involve all major muscle groups on 2 or more days a week.  We discussed the following behavioral modification strategies today: meal planning and cooking strategies and planning for success.  Scott Hensley has agreed to follow-up with our clinic in 3 to 4 weeks. He was informed of the importance of frequent follow-up visits to maximize his success with intensive lifestyle modifications for his multiple health conditions.   Scott Hensley was informed we would discuss his lab results at his next visit unless there is a critical issue that needs to be addressed sooner. Scott Hensley agreed to keep his next visit at the agreed upon time to discuss these results.  Objective:   Blood pressure 95/61, pulse (!) 58, temperature 97.8 F (36.6 C), temperature source Oral, height 6\' 5"  (1.956 m), weight 240 lb (108.9 kg), SpO2 100 %. Body mass index is 28.46 kg/m.  General: Cooperative, alert, well developed, in no acute distress. HEENT: Conjunctivae and lids unremarkable. Neck: No thyromegaly.  Cardiovascular: Regular rhythm.  Lungs: Normal work of breathing. Extremities: No edema.  Neurologic: No focal deficits.   Lab Results  Component Value Date   CREATININE 1.19 04/16/2019   BUN 19 04/16/2019   NA 141 04/16/2019   K 4.5 04/16/2019   CL 104 04/16/2019   CO2 23 04/16/2019   Lab Results  Component Value Date   ALT 41 04/16/2019   AST 40 04/16/2019   ALKPHOS 88 04/16/2019   BILITOT 0.6 04/16/2019   Lab Results  Component Value Date   HGBA1C 5.6 04/16/2019   HGBA1C 5.5 11/18/2018   HGBA1C 5.4 04/29/2018   Lab Results  Component Value Date   INSULIN 6.6 04/16/2019   INSULIN 11.9 11/18/2018   Lab Results  Component Value Date   TSH 2.270 11/18/2018   Lab Results  Component Value Date   CHOL 191 04/16/2019   HDL 50 04/16/2019   LDLCALC 128  (H) 04/16/2019   TRIG 69 04/16/2019   CHOLHDL 3.7 06/28/2018   Lab Results  Component Value Date   WBC 4.8 04/29/2018   HGB 15.6 04/29/2018   HCT 45.8 04/29/2018   MCV 86 04/29/2018   PLT 269 04/29/2018   No results found for: IRON, TIBC, FERRITIN  Obesity Behavioral Intervention Documentation for Insurance:   Approximately 15 minutes were spent on the discussion below.  ASK: We discussed the diagnosis of obesity with 05/01/2018 today and Yacob agreed to give Scott Hensley permission to discuss obesity behavioral modification therapy today.  ASSESS: Marce has the diagnosis of obesity and his BMI today is 28.45. Jayquan is in the action stage of change.   ADVISE: Jensyn was educated on the multiple health risks of obesity as well as the benefit of weight loss to improve his health. He was advised of the need for long term treatment and the importance of lifestyle modifications to improve his current health and to decrease his risk of future health problems.  AGREE: Multiple dietary modification options and treatment options were discussed and Nasir agreed to follow the recommendations documented in the above note.  ARRANGE: Richar was educated on the importance of frequent visits to  treat obesity as outlined per CMS and USPSTF guidelines and agreed to schedule his next follow up appointment today.  Attestation Statements:   Reviewed by clinician on day of visit: allergies, medications, problem list, medical history, surgical history, family history, social history, and previous encounter notes.  This visit occurred during the SARS-CoV-2 public health emergency. Safety protocols were in place, including screening questions prior to the visit, additional usage of staff PPE, and extensive cleaning of exam room while observing appropriate contact time as indicated for disinfecting solutions. (CPT [id number])  I, Doreene Nest, am acting as transcriptionist for Abby Potash, PA-C.  I have reviewed the  above documentation for accuracy and completeness, and I agree with the above. Abby Potash, PA-C

## 2019-05-05 ENCOUNTER — Other Ambulatory Visit (INDEPENDENT_AMBULATORY_CARE_PROVIDER_SITE_OTHER): Payer: Self-pay | Admitting: Physician Assistant

## 2019-05-05 DIAGNOSIS — J029 Acute pharyngitis, unspecified: Secondary | ICD-10-CM | POA: Diagnosis not present

## 2019-05-05 DIAGNOSIS — Z20828 Contact with and (suspected) exposure to other viral communicable diseases: Secondary | ICD-10-CM | POA: Diagnosis not present

## 2019-05-05 DIAGNOSIS — E559 Vitamin D deficiency, unspecified: Secondary | ICD-10-CM

## 2019-05-05 DIAGNOSIS — Z7189 Other specified counseling: Secondary | ICD-10-CM | POA: Diagnosis not present

## 2019-05-14 ENCOUNTER — Ambulatory Visit (INDEPENDENT_AMBULATORY_CARE_PROVIDER_SITE_OTHER): Payer: BC Managed Care – PPO | Admitting: Physician Assistant

## 2019-05-14 ENCOUNTER — Other Ambulatory Visit: Payer: Self-pay

## 2019-05-14 ENCOUNTER — Encounter (INDEPENDENT_AMBULATORY_CARE_PROVIDER_SITE_OTHER): Payer: Self-pay | Admitting: Physician Assistant

## 2019-05-14 VITALS — BP 91/55 | HR 60 | Temp 98.1°F | Ht 77.0 in | Wt 236.0 lb

## 2019-05-14 DIAGNOSIS — E7849 Other hyperlipidemia: Secondary | ICD-10-CM

## 2019-05-14 DIAGNOSIS — Z9189 Other specified personal risk factors, not elsewhere classified: Secondary | ICD-10-CM

## 2019-05-14 DIAGNOSIS — E669 Obesity, unspecified: Secondary | ICD-10-CM | POA: Diagnosis not present

## 2019-05-14 DIAGNOSIS — E559 Vitamin D deficiency, unspecified: Secondary | ICD-10-CM

## 2019-05-14 DIAGNOSIS — Z6830 Body mass index (BMI) 30.0-30.9, adult: Secondary | ICD-10-CM

## 2019-05-14 MED ORDER — VITAMIN D (ERGOCALCIFEROL) 1.25 MG (50000 UNIT) PO CAPS: 50000.0000 [IU] | ORAL_CAPSULE | ORAL | 0 refills | Status: DC

## 2019-05-14 NOTE — Progress Notes (Signed)
Chief Complaint:   OBESITY Scott Hensley is here to discuss his progress with his obesity treatment plan along with follow-up of his obesity related diagnoses. [name] is on the Category 4 Plan and states he is following his eating plan approximately 95% of the time. [name] states he is doing resistance/cardio 50 minutes 3 times per week.  Today's visit was #: 9 Starting weight: 284 lbs Starting date: 11/18/2018 Today's weight: 236 lbs Today's date: 05/14/2019 Total lbs lost to date: 48 Total lbs lost since last in-office visit: 4  Interim History: Scott Hensley states that he is doing well overall on the plan. He sometimes misses eating sweets, but is not having cravings.  Subjective:   Vitamin D deficiency. Last Vitamin D level 44.5 on 04/16/2019 (improved from 30.4 on 09/19/2018). He is on weekly Vitamin D. No nausea, vomiting, or muscle weakness.  Other hyperlipidemia. Alcus is on Lipitor. No chest pain or headache. No myalgias. LDL worsened over the last 3 months. He has familial hyperlipidemia.   Lab Results  Component Value Date   CHOL 191 04/16/2019   HDL 50 04/16/2019   LDLCALC 128 (H) 04/16/2019   TRIG 69 04/16/2019   CHOLHDL 3.7 06/28/2018   Lab Results  Component Value Date   ALT 41 04/16/2019   AST 40 04/16/2019   ALKPHOS 88 04/16/2019   BILITOT 0.6 04/16/2019   The 10-year ASCVD risk score Mikey Bussing DC Jr., et al., 2013) is: 1.2%   Values used to calculate the score:     Age: 28 years     Sex: Male     Is Non-Hispanic African American: No     Diabetic: No     Tobacco smoker: No     Systolic Blood Pressure: 91 mmHg     Is BP treated: No     HDL Cholesterol: 50 mg/dL     Total Cholesterol: 191 mg/dL  At risk for heart disease. Scott Hensley is at a higher than average risk for cardiovascular disease due to obesity. Reviewed: no chest pain on exertion, no dyspnea on exertion, and no swelling of ankles.  Assessment/Plan:   Vitamin D deficiency. Low Vitamin D level contributes  to fatigue and are associated with obesity, breast, and colon cancer. He was given a refill prescription for Vitamin D, Ergocalciferol, (DRISDOL) 1.25 MG (50000 UNIT) CAPS capsule every week #4 with 0 refills and will follow-up for routine testing of Vitamin D, at least 2-3 times per year to avoid over-replacement.     Other hyperlipidemia. Cardiovascular risk and specific lipid/LDL goals reviewed.  We discussed several lifestyle modifications today and Courtez will continue his medications as prescribed, work on diet, exercise and weight loss efforts. Orders and follow up as documented in patient record. Will continue to monitor.  Counseling Intensive lifestyle modifications are the first line treatment for this issue. . Dietary changes: Increase soluble fiber. Decrease simple carbohydrates. . Exercise changes: Moderate to vigorous-intensity aerobic activity 150 minutes per week if tolerated. . Lipid-lowering medications: see documented in medical record.  At risk for heart disease. Scott Hensley was given approximately 15 minutes of coronary artery disease prevention counseling today. He is 49 y.o. male and has risk factors for heart disease including obesity. We discussed intensive lifestyle modifications today with an emphasis on specific weight loss instructions and strategies.   Repetitive spaced learning was employed today to elicit superior memory formation and behavioral change.  Class 1 obesity with serious comorbidity and body mass index (BMI) of  30.0 to 30.9 in adult, unspecified obesity type - Starting BMI greater then 30.  Scott Hensley is currently in the action stage of change. As such, his goal is to continue with weight loss efforts. He has agreed to the Category 4 Plan.   Exercise goals: For substantial health benefits, adults should do at least 150 minutes (2 hours and 30 minutes) a week of moderate-intensity, or 75 minutes (1 hour and 15 minutes) a week of vigorous-intensity aerobic physical  activity, or an equivalent combination of moderate- and vigorous-intensity aerobic activity. Aerobic activity should be performed in episodes of at least 10 minutes, and preferably, it should be spread throughout the week.  Behavioral modification strategies: meal planning and cooking strategies and keeping healthy foods in the home.  Scott Hensley has agreed to follow-up with our clinic in 3-4 weeks. He was informed of the importance of frequent follow-up visits to maximize his success with intensive lifestyle modifications for his multiple health conditions.   Objective:   Blood pressure (!) 91/55, pulse 60, temperature 98.1 F (36.7 C), temperature source Oral, height 6\' 5"  (1.956 m), weight 236 lb (107 kg), SpO2 100 %. Body mass index is 27.99 kg/m.  General: Cooperative, alert, well developed, in no acute distress. HEENT: Conjunctivae and lids unremarkable. Cardiovascular: Regular rhythm.  Lungs: Normal work of breathing. Neurologic: No focal deficits.   Lab Results  Component Value Date   CREATININE 1.19 04/16/2019   BUN 19 04/16/2019   NA 141 04/16/2019   K 4.5 04/16/2019   CL 104 04/16/2019   CO2 23 04/16/2019   Lab Results  Component Value Date   ALT 41 04/16/2019   AST 40 04/16/2019   ALKPHOS 88 04/16/2019   BILITOT 0.6 04/16/2019   Lab Results  Component Value Date   HGBA1C 5.6 04/16/2019   HGBA1C 5.5 11/18/2018   HGBA1C 5.4 04/29/2018   Lab Results  Component Value Date   INSULIN 6.6 04/16/2019   INSULIN 11.9 11/18/2018   Lab Results  Component Value Date   TSH 2.270 11/18/2018   Lab Results  Component Value Date   CHOL 191 04/16/2019   HDL 50 04/16/2019   LDLCALC 128 (H) 04/16/2019   TRIG 69 04/16/2019   CHOLHDL 3.7 06/28/2018   Lab Results  Component Value Date   WBC 4.8 04/29/2018   HGB 15.6 04/29/2018   HCT 45.8 04/29/2018   MCV 86 04/29/2018   PLT 269 04/29/2018   No results found for: IRON, TIBC, FERRITIN  Attestation Statements:    Reviewed by clinician on day of visit: allergies, medications, problem list, medical history, surgical history, family history, social history, and previous encounter notes.  I01/22/2020, am acting as transcriptionist for Marianna Payment, PA-C   I have reviewed the above documentation for accuracy and completeness, and I agree with the above. Alois Cliche, PA-C

## 2019-06-05 ENCOUNTER — Other Ambulatory Visit (INDEPENDENT_AMBULATORY_CARE_PROVIDER_SITE_OTHER): Payer: Self-pay | Admitting: Physician Assistant

## 2019-06-05 DIAGNOSIS — E559 Vitamin D deficiency, unspecified: Secondary | ICD-10-CM

## 2019-06-11 ENCOUNTER — Other Ambulatory Visit: Payer: Self-pay

## 2019-06-11 ENCOUNTER — Ambulatory Visit (INDEPENDENT_AMBULATORY_CARE_PROVIDER_SITE_OTHER): Payer: BC Managed Care – PPO | Admitting: Physician Assistant

## 2019-06-11 ENCOUNTER — Encounter (INDEPENDENT_AMBULATORY_CARE_PROVIDER_SITE_OTHER): Payer: Self-pay | Admitting: Physician Assistant

## 2019-06-11 VITALS — BP 100/62 | HR 59 | Temp 97.9°F | Ht 77.0 in | Wt 231.0 lb

## 2019-06-11 DIAGNOSIS — Z6830 Body mass index (BMI) 30.0-30.9, adult: Secondary | ICD-10-CM

## 2019-06-11 DIAGNOSIS — E669 Obesity, unspecified: Secondary | ICD-10-CM

## 2019-06-11 DIAGNOSIS — E559 Vitamin D deficiency, unspecified: Secondary | ICD-10-CM | POA: Diagnosis not present

## 2019-06-11 DIAGNOSIS — E7849 Other hyperlipidemia: Secondary | ICD-10-CM | POA: Diagnosis not present

## 2019-06-11 DIAGNOSIS — Z9189 Other specified personal risk factors, not elsewhere classified: Secondary | ICD-10-CM

## 2019-06-11 MED ORDER — VITAMIN D (ERGOCALCIFEROL) 1.25 MG (50000 UNIT) PO CAPS: 50000.0000 [IU] | ORAL_CAPSULE | ORAL | 0 refills | Status: DC

## 2019-06-11 NOTE — Progress Notes (Signed)
Chief Complaint:   OBESITY Scott Hensley is here to discuss his progress with his obesity treatment plan along with follow-up of his obesity related diagnoses. Scott Hensley is on the Category 4 Plan and states he is following his eating plan approximately 85% of the time. Scott Hensley states he is exercising for 0 minutes 0 times per week.  Today's visit was #: 10 Starting weight: 284 lbs Starting date: 11/18/2018 Today's weight: 231 lbs Today's date: 06/11/2019 Total lbs lost to date: 53 lbs Total lbs lost since last in-office visit: 5 lbs  Interim History: Scott Hensley reports that he took a break from working out for the last 2 weeks.  He is following the plan closely and notes that his hunger is controlled most days.  Subjective:   1. Vitamin D deficiency Carry's Vitamin D level was 44.5 on 04/16/2019. He is currently taking vit D. He denies nausea, vomiting or muscle weakness.  2. Other hyperlipidemia Scott Hensley has hyperlipidemia and has been trying to improve his cholesterol levels with intensive lifestyle modification including a low saturated fat diet, exercise and weight loss. He denies any chest pain, claudication or myalgias.  He is taking atorvastatin.  Lab Results  Component Value Date   ALT 41 04/16/2019   AST 40 04/16/2019   ALKPHOS 88 04/16/2019   BILITOT 0.6 04/16/2019   Lab Results  Component Value Date   CHOL 191 04/16/2019   HDL 50 04/16/2019   LDLCALC 128 (H) 04/16/2019   TRIG 69 04/16/2019   CHOLHDL 3.7 06/28/2018   3. At risk for heart disease Scott Hensley is at a higher than average risk for cardiovascular disease due to obesity. Reviewed: no chest pain on exertion, no dyspnea on exertion, and no swelling of ankles.  Assessment/Plan:   1. Vitamin D deficiency Low Vitamin D level contributes to fatigue and are associated with obesity, breast, and colon cancer. He agrees to continue to take prescription Vitamin D @50 ,000 IU every week and will follow-up for routine testing of Vitamin D, at  least 2-3 times per year to avoid over-replacement. - Vitamin D, Ergocalciferol, (DRISDOL) 1.25 MG (50000 UNIT) CAPS capsule; Take 1 capsule (50,000 Units total) by mouth every 7 (seven) days.  Dispense: 4 capsule; Refill: 0  2. Other hyperlipidemia Cardiovascular risk and specific lipid/LDL goals reviewed.  We discussed several lifestyle modifications today and Scott Hensley will continue to work on diet, exercise and weight loss efforts. Orders and follow up as documented in patient record.   Counseling Intensive lifestyle modifications are the first line treatment for this issue. . Dietary changes: Increase soluble fiber. Decrease simple carbohydrates. . Exercise changes: Moderate to vigorous-intensity aerobic activity 150 minutes per week if tolerated. . Lipid-lowering medications: see documented in medical record.  3. At risk for heart disease Scott Hensley was given approximately 15 minutes of coronary artery disease prevention counseling today. He is 49 y.o. male and has risk factors for heart disease including obesity. We discussed intensive lifestyle modifications today with an emphasis on specific weight loss instructions and strategies.   Repetitive spaced learning was employed today to elicit superior memory formation and behavioral change.  4. Class 1 obesity with serious comorbidity and body mass index (BMI) of 30.0 to 30.9 in adult, unspecified obesity type Scott Hensley is currently in the action stage of change. As such, his goal is to continue with weight loss efforts. He has agreed to the Category 4 Plan.   Exercise goals: For substantial health benefits, adults should do at least 150  minutes (2 hours and 30 minutes) a week of moderate-intensity, or 75 minutes (1 hour and 15 minutes) a week of vigorous-intensity aerobic physical activity, or an equivalent combination of moderate- and vigorous-intensity aerobic activity. Aerobic activity should be performed in episodes of at least 10 minutes, and  preferably, it should be spread throughout the week.  Behavioral modification strategies: meal planning and cooking strategies and keeping healthy foods in the home.  Scott Hensley has agreed to follow-up with our clinic in 4 weeks. He was informed of the importance of frequent follow-up visits to maximize his success with intensive lifestyle modifications for his multiple health conditions.   Objective:   Blood pressure 100/62, pulse (!) 59, temperature 97.9 F (36.6 C), temperature source Oral, height 6\' 5"  (1.956 m), weight 231 lb (104.8 kg), SpO2 100 %. Body mass index is 27.39 kg/m.  General: Cooperative, alert, well developed, in no acute distress. HEENT: Conjunctivae and lids unremarkable. Cardiovascular: Regular rhythm.  Lungs: Normal work of breathing. Neurologic: No focal deficits.   Lab Results  Component Value Date   CREATININE 1.19 04/16/2019   BUN 19 04/16/2019   NA 141 04/16/2019   K 4.5 04/16/2019   CL 104 04/16/2019   CO2 23 04/16/2019   Lab Results  Component Value Date   ALT 41 04/16/2019   AST 40 04/16/2019   ALKPHOS 88 04/16/2019   BILITOT 0.6 04/16/2019   Lab Results  Component Value Date   HGBA1C 5.6 04/16/2019   HGBA1C 5.5 11/18/2018   HGBA1C 5.4 04/29/2018   Lab Results  Component Value Date   INSULIN 6.6 04/16/2019   INSULIN 11.9 11/18/2018   Lab Results  Component Value Date   TSH 2.270 11/18/2018   Lab Results  Component Value Date   CHOL 191 04/16/2019   HDL 50 04/16/2019   LDLCALC 128 (H) 04/16/2019   TRIG 69 04/16/2019   CHOLHDL 3.7 06/28/2018   Lab Results  Component Value Date   WBC 4.8 04/29/2018   HGB 15.6 04/29/2018   HCT 45.8 04/29/2018   MCV 86 04/29/2018   PLT 269 04/29/2018   Attestation Statements:   Reviewed by clinician on day of visit: allergies, medications, problem list, medical history, surgical history, family history, social history, and previous encounter notes.  I, 05/01/2018, CMA, am acting as  Insurance claims handler for [employer], PA-C.  I have reviewed the above documentation for accuracy and completeness, and I agree with the above. [employer], PA-C

## 2019-06-22 ENCOUNTER — Other Ambulatory Visit: Payer: Self-pay | Admitting: Family Medicine

## 2019-06-22 DIAGNOSIS — E7800 Pure hypercholesterolemia, unspecified: Secondary | ICD-10-CM

## 2019-06-23 NOTE — Telephone Encounter (Signed)
Is this okay to refill? Labs were done in Jan by [facility].

## 2019-07-16 ENCOUNTER — Encounter (INDEPENDENT_AMBULATORY_CARE_PROVIDER_SITE_OTHER): Payer: Self-pay | Admitting: Physician Assistant

## 2019-07-16 ENCOUNTER — Ambulatory Visit (INDEPENDENT_AMBULATORY_CARE_PROVIDER_SITE_OTHER): Payer: BC Managed Care – PPO | Admitting: Physician Assistant

## 2019-07-16 ENCOUNTER — Other Ambulatory Visit: Payer: Self-pay

## 2019-07-16 VITALS — BP 95/59 | HR 61 | Temp 97.9°F | Ht 77.0 in | Wt 230.0 lb

## 2019-07-16 DIAGNOSIS — E66811 Obesity, class 1: Secondary | ICD-10-CM

## 2019-07-16 DIAGNOSIS — E7849 Other hyperlipidemia: Secondary | ICD-10-CM | POA: Diagnosis not present

## 2019-07-16 DIAGNOSIS — E669 Obesity, unspecified: Secondary | ICD-10-CM | POA: Diagnosis not present

## 2019-07-16 DIAGNOSIS — Z6830 Body mass index (BMI) 30.0-30.9, adult: Secondary | ICD-10-CM

## 2019-07-16 DIAGNOSIS — E559 Vitamin D deficiency, unspecified: Secondary | ICD-10-CM | POA: Diagnosis not present

## 2019-07-16 DIAGNOSIS — Z9189 Other specified personal risk factors, not elsewhere classified: Secondary | ICD-10-CM | POA: Diagnosis not present

## 2019-07-16 MED ORDER — VITAMIN D (ERGOCALCIFEROL) 1.25 MG (50000 UNIT) PO CAPS: 50000.0000 [IU] | ORAL_CAPSULE | ORAL | 0 refills | Status: DC

## 2019-07-16 NOTE — Progress Notes (Signed)
Chief Complaint:   OBESITY [name] is here to discuss his progress with his obesity treatment plan along with follow-up of his obesity related diagnoses. Scott Hensley is on the Category 4 Plan and states he is following his eating plan approximately 65% of the time. Scott Hensley states he is doing resistance training/running 30-40 minutes 2 times per week.  Today's visit was #: 11 Starting weight: 284 lbs Starting date: 11/18/2018 Today's weight: 230 lbs Today's date: 07/16/2019 Total lbs lost to date: 54  Total lbs lost since last in-office visit: 1  Interim History: Scott Hensley reports that he wasn't as on plan with spring break and getting his COVID vaccination. He also didn't work out as much and engaged in more social drinking than normal. He is ready to get back on track.  Subjective:   Vitamin D deficiency. Scott Hensley is on prescription Vitamin D. No nausea, vomiting, or muscle weakness. Last Vitamin D 44.5 on 04/16/2019. He will be due for labs at his next visit.  Other hyperlipidemia. Scott Hensley is on Crestor. No chest pain. No myalgias.   Lab Results  Component Value Date   CHOL 191 04/16/2019   HDL 50 04/16/2019   LDLCALC 128 (H) 04/16/2019   TRIG 69 04/16/2019   CHOLHDL 3.7 06/28/2018   Lab Results  Component Value Date   ALT 41 04/16/2019   AST 40 04/16/2019   ALKPHOS 88 04/16/2019   BILITOT 0.6 04/16/2019   The 10-year ASCVD risk score Scott Bussing DC Jr., et al., 2013) is: 1.2%   Values used to calculate the score:     Age: 49 years     Sex: Male     Is Non-Hispanic African American: No     Diabetic: No     Tobacco smoker: No     Systolic Blood Pressure: 95 mmHg     Is BP treated: No     HDL Cholesterol: 50 mg/dL     Total Cholesterol: 191 mg/dL  At risk for osteoporosis. Scott Hensley is at higher risk of osteopenia and osteoporosis due to Vitamin D deficiency.    Assessment/Plan:   Vitamin D deficiency. Low Vitamin D level contributes to fatigue and are associated with obesity, breast,  and colon cancer. He was given a refill on his Vitamin D, Ergocalciferol, (DRISDOL) 1.25 MG (50000 UNIT) CAPS capsule every week #4 with 0 refills and will follow-up for routine testing of Vitamin D, at least 2-3 times per year to avoid over-replacement.    Other hyperlipidemia. Cardiovascular risk and specific lipid/LDL goals reviewed.  We discussed several lifestyle modifications today and Scott Hensley will continue to work on diet, exercise and weight loss efforts. Orders and follow up as documented in patient record. Scott Hensley will continue his medication as directed.  Counseling Intensive lifestyle modifications are the first line treatment for this issue. . Dietary changes: Increase soluble fiber. Decrease simple carbohydrates. . Exercise changes: Moderate to vigorous-intensity aerobic activity 150 minutes per week if tolerated. . Lipid-lowering medications: see documented in medical record.  At risk for osteoporosis. Scott Hensley was given approximately 15 minutes of osteoporosis prevention counseling today. Scott Hensley is at risk for osteopenia and osteoporosis due to his Vitamin D deficiency. He was encouraged to take his Vitamin D and follow his higher calcium diet and increase strengthening exercise to help strengthen his bones and decrease his risk of osteopenia and osteoporosis.  Repetitive spaced learning was employed today to elicit superior memory formation and behavioral change.  Class 1 obesity with serious  comorbidity and body mass index (BMI) of 30.0 to 30.9 in adult, unspecified obesity type - BMI greater than 30 at start of program.   Scott Hensley is currently in the action stage of change. As such, his goal is to continue with weight loss efforts. He has agreed to the Category 4 Plan.   Exercise goals: For substantial health benefits, adults should do at least 150 minutes (2 hours and 30 minutes) a week of moderate-intensity, or 75 minutes (1 hour and 15 minutes) a week of vigorous-intensity aerobic physical  activity, or an equivalent combination of moderate- and vigorous-intensity aerobic activity. Aerobic activity should be performed in episodes of at least 10 minutes, and preferably, it should be spread throughout the week.  Behavioral modification strategies: meal planning and cooking strategies and keeping healthy foods in the home.  Scott Hensley has agreed to follow-up with our clinic in 4 weeks. He was informed of the importance of frequent follow-up visits to maximize his success with intensive lifestyle modifications for his multiple health conditions.   Objective:   Blood pressure (!) 95/59, pulse 61, temperature 97.9 F (36.6 C), temperature source Oral, height 6\' 5"  (1.956 m), weight 230 lb (104.3 kg), SpO2 99 %. Body mass index is 27.27 kg/m.  General: Cooperative, alert, well developed, in no acute distress. HEENT: Conjunctivae and lids unremarkable. Cardiovascular: Regular rhythm.  Lungs: Normal work of breathing. Neurologic: No focal deficits.   Lab Results  Component Value Date   CREATININE 1.19 04/16/2019   BUN 19 04/16/2019   NA 141 04/16/2019   K 4.5 04/16/2019   CL 104 04/16/2019   CO2 23 04/16/2019   Lab Results  Component Value Date   ALT 41 04/16/2019   AST 40 04/16/2019   ALKPHOS 88 04/16/2019   BILITOT 0.6 04/16/2019   Lab Results  Component Value Date   HGBA1C 5.6 04/16/2019   HGBA1C 5.5 11/18/2018   HGBA1C 5.4 04/29/2018   Lab Results  Component Value Date   INSULIN 6.6 04/16/2019   INSULIN 11.9 11/18/2018   Lab Results  Component Value Date   TSH 2.270 11/18/2018   Lab Results  Component Value Date   CHOL 191 04/16/2019   HDL 50 04/16/2019   LDLCALC 128 (H) 04/16/2019   TRIG 69 04/16/2019   CHOLHDL 3.7 06/28/2018   Lab Results  Component Value Date   WBC 4.8 04/29/2018   HGB 15.6 04/29/2018   HCT 45.8 04/29/2018   MCV 86 04/29/2018   PLT 269 04/29/2018   No results found for: IRON, TIBC, FERRITIN  Attestation Statements:    Reviewed by clinician on day of visit: allergies, medications, problem list, medical history, surgical history, family history, social history, and previous encounter notes.  I01/22/2020, am acting as transcriptionist for Marianna Payment, PA-C   I have reviewed the above documentation for accuracy and completeness, and I agree with the above. Alois Cliche, PA-C

## 2019-08-13 ENCOUNTER — Encounter (INDEPENDENT_AMBULATORY_CARE_PROVIDER_SITE_OTHER): Payer: Self-pay | Admitting: Physician Assistant

## 2019-08-13 ENCOUNTER — Ambulatory Visit (INDEPENDENT_AMBULATORY_CARE_PROVIDER_SITE_OTHER): Payer: BC Managed Care – PPO | Admitting: Physician Assistant

## 2019-08-13 ENCOUNTER — Other Ambulatory Visit: Payer: Self-pay

## 2019-08-13 VITALS — BP 97/59 | HR 58 | Temp 97.6°F | Ht 77.0 in | Wt 231.0 lb

## 2019-08-13 DIAGNOSIS — E7849 Other hyperlipidemia: Secondary | ICD-10-CM

## 2019-08-13 DIAGNOSIS — Z9189 Other specified personal risk factors, not elsewhere classified: Secondary | ICD-10-CM

## 2019-08-13 DIAGNOSIS — R739 Hyperglycemia, unspecified: Secondary | ICD-10-CM

## 2019-08-13 DIAGNOSIS — E669 Obesity, unspecified: Secondary | ICD-10-CM

## 2019-08-13 DIAGNOSIS — E559 Vitamin D deficiency, unspecified: Secondary | ICD-10-CM

## 2019-08-13 DIAGNOSIS — Z6830 Body mass index (BMI) 30.0-30.9, adult: Secondary | ICD-10-CM

## 2019-08-13 MED ORDER — VITAMIN D (ERGOCALCIFEROL) 1.25 MG (50000 UNIT) PO CAPS: 50000.0000 [IU] | ORAL_CAPSULE | ORAL | 0 refills | Status: DC

## 2019-08-13 NOTE — Progress Notes (Signed)
Chief Complaint:   OBESITY Scott Hensley is here to discuss his progress with his obesity treatment plan along with follow-up of his obesity related diagnoses. Scott Hensley is on the Category 4 Plan and states he is following his eating plan approximately 90% of the time. Scott Hensley states he is doing resistance exercises 35-40 minutes 5 times per week.  Today's visit was #: 12 Starting weight: 284 lbs Starting date: 11/18/2018 Today's weight: 231 lbs Today's date: 08/13/2019 Total lbs lost to date: 53 Total lbs lost since last in-office visit: 0  Interim History: Scott Hensley reports that he has started a new workout program on [employer] and is enjoying it. He has been snacking more here and there, but feels that he has followed the plan well overall.  Subjective:   Hyperglycemia. Scott Hensley has a history of some elevated blood glucose readings without a diagnosis of diabetes. He denies polyphagia. Scott Hensley is on no medication and is exercising regularly. He is due for labs.  Vitamin D deficiency. Scott Hensley is on prescription Vitamin D. No nausea, vomiting, or muscle weakness. Last Vitamin D 44.5 on 04/16/2019.  Other hyperlipidemia. Scott Hensley is on atorvastatin. No chest pain or myalgias. He is exercising regularly. Is due for labs.  Lab Results  Component Value Date   CHOL 191 04/16/2019   HDL 50 04/16/2019   LDLCALC 128 (H) 04/16/2019   TRIG 69 04/16/2019   CHOLHDL 3.7 06/28/2018   Lab Results  Component Value Date   ALT 41 04/16/2019   AST 40 04/16/2019   ALKPHOS 88 04/16/2019   BILITOT 0.6 04/16/2019   The 10-year ASCVD risk score Scott Hensley DC Jr., Scott al., Scott Hensley) is: 1.3%   Values used to calculate the score:     Age: 49 years     Sex: Male     Is Non-Hispanic African American: No     Diabetic: No     Tobacco smoker: No     Systolic Blood Pressure: 97 mmHg     Is BP treated: No     HDL Cholesterol: 50 mg/dL     Total Cholesterol: 191 mg/dL  At risk for diabetes mellitus. Scott Hensley is at higher than average  risk for developing diabetes due to his obesity.   Assessment/Plan:   Hyperglycemia. Fasting labs will be obtained and results with be discussed with Scott Hensley in 2 weeks at his follow up visit. In the meanwhile Scott Hensley was started on a lower simple carbohydrate diet and will work on weight loss efforts. Comprehensive metabolic panel, Hemoglobin A1c, Insulin, random labs ordered today.  Vitamin D deficiency. Low Vitamin D level contributes to fatigue and are associated with obesity, breast, and colon cancer. He was given a refill on his Vitamin D, Ergocalciferol, (DRISDOL) 1.25 MG (50000 UNIT) CAPS capsule every week #4 with 0 refills and VITAMIN D 25 Hydroxy (Vit-D Deficiency, Fractures) level was ordered today.  Other hyperlipidemia. Cardiovascular risk and specific lipid/LDL goals reviewed.  We discussed several lifestyle modifications today and Scott Hensley will continue to work on diet, exercise and weight loss efforts. Orders and follow up as documented in patient record. Scott Hensley will continue his medication as directed. Lipid Panel With LDL/HDL Ratio ordered today.  Counseling Intensive lifestyle modifications are the first line treatment for this issue. . Dietary changes: Increase soluble fiber. Decrease simple carbohydrates. . Exercise changes: Moderate to vigorous-intensity aerobic activity 150 minutes per week if tolerated. . Lipid-lowering medications: see documented in medical record.   At risk for diabetes  mellitus. Scott Hensley was given approximately 15 minutes of diabetes education and counseling today. We discussed intensive lifestyle modifications today with an emphasis on weight loss as well as increasing exercise and decreasing simple carbohydrates in his diet. We also reviewed medication options with an emphasis on risk versus benefit of those discussed.   Repetitive spaced learning was employed today to elicit superior memory formation and behavioral change.  Class 1 obesity with serious comorbidity  and body mass index (BMI) of 30.0 to 30.9 in adult, unspecified obesity type - Starting BMI greater then 30.  Scott Hensley is currently in the action stage of change. As such, his goal is to continue with weight loss efforts. He has agreed to the Category 4 Plan.   Exercise goals: For substantial health benefits, adults should do at least 150 minutes (2 hours and 30 minutes) a week of moderate-intensity, or 75 minutes (1 hour and 15 minutes) a week of vigorous-intensity aerobic physical activity, or an equivalent combination of moderate- and vigorous-intensity aerobic activity. Aerobic activity should be performed in episodes of at least 10 minutes, and preferably, it should be spread throughout the week.  Behavioral modification strategies: meal planning and cooking strategies and avoiding temptations.  Scott Hensley has agreed to follow-up with our clinic in 4 weeks. He was informed of the importance of frequent follow-up visits to maximize his success with intensive lifestyle modifications for his multiple health conditions.   Scott Hensley was informed we would discuss his lab results at his next visit unless there is a critical issue that needs to be addressed sooner. Scott Hensley agreed to keep his next visit at the agreed upon time to discuss these results.  Objective:   Blood pressure (!) 97/59, pulse (!) 58, temperature 97.6 F (36.4 C), temperature source Oral, height 6\' 5"  (1.956 m), weight 231 lb (104.8 kg), SpO2 100 %. Body mass index is 27.39 kg/m.  General: Cooperative, alert, well developed, in no acute distress. HEENT: Conjunctivae and lids unremarkable. Cardiovascular: Regular rhythm.  Lungs: Normal work of breathing. Neurologic: No focal deficits.   Lab Results  Component Value Date   CREATININE 1.19 04/16/2019   BUN 19 04/16/2019   NA 141 04/16/2019   K 4.5 04/16/2019   CL 104 04/16/2019   CO2 23 04/16/2019   Lab Results  Component Value Date   ALT 41 04/16/2019   AST 40 04/16/2019   ALKPHOS  88 04/16/2019   BILITOT 0.6 04/16/2019   Lab Results  Component Value Date   HGBA1C 5.6 04/16/2019   HGBA1C 5.5 11/18/2018   HGBA1C 5.4 04/29/2018   Lab Results  Component Value Date   INSULIN 6.6 04/16/2019   INSULIN 11.9 11/18/2018   Lab Results  Component Value Date   TSH 2.270 11/18/2018   Lab Results  Component Value Date   CHOL 191 04/16/2019   HDL 50 04/16/2019   LDLCALC 128 (H) 04/16/2019   TRIG 69 04/16/2019   CHOLHDL 3.7 06/28/2018   Lab Results  Component Value Date   WBC 4.8 04/29/2018   HGB 15.6 04/29/2018   HCT 45.8 04/29/2018   MCV 86 04/29/2018   PLT 269 04/29/2018   No results found for: IRON, TIBC, FERRITIN  Attestation Statements:   Reviewed by clinician on day of visit: allergies, medications, problem list, medical history, surgical history, family history, social history, and previous encounter notes.  I01/22/2020, am acting as transcriptionist for Marianna Payment, PA-C   I have reviewed the above documentation for accuracy and completeness,  and I agree with the above. Abby Potash, PA-C

## 2019-08-14 LAB — COMPREHENSIVE METABOLIC PANEL
ALT: 27 IU/L (ref 0–44)
AST: 23 IU/L (ref 0–40)
Albumin/Globulin Ratio: 2 (ref 1.2–2.2)
Albumin: 4.5 g/dL (ref 4.0–5.0)
Alkaline Phosphatase: 80 IU/L (ref 39–117)
BUN/Creatinine Ratio: 13 (ref 9–20)
BUN: 18 mg/dL (ref 6–24)
Bilirubin Total: 0.6 mg/dL (ref 0.0–1.2)
CO2: 24 mmol/L (ref 20–29)
Calcium: 9.3 mg/dL (ref 8.7–10.2)
Chloride: 105 mmol/L (ref 96–106)
Creatinine, Ser: 1.35 mg/dL — ABNORMAL HIGH (ref 0.76–1.27)
GFR calc Af Amer: 72 mL/min/{1.73_m2} (ref >59)
GFR calc non Af Amer: 63 mL/min/{1.73_m2} (ref >59)
Globulin, Total: 2.2 g/dL (ref 1.5–4.5)
Glucose: 95 mg/dL (ref 65–99)
Potassium: 4.7 mmol/L (ref 3.5–5.2)
Sodium: 141 mmol/L (ref 134–144)
Total Protein: 6.7 g/dL (ref 6.0–8.5)

## 2019-08-14 LAB — HEMOGLOBIN A1C
Est. average glucose Bld gHb Est-mCnc: 111 mg/dL
Hgb A1c MFr Bld: 5.5 % (ref 4.8–5.6)

## 2019-08-14 LAB — LIPID PANEL WITH LDL/HDL RATIO
Cholesterol, Total: 166 mg/dL (ref 100–199)
HDL: 59 mg/dL (ref >39)
LDL Chol Calc (NIH): 95 mg/dL (ref 0–99)
LDL/HDL Ratio: 1.6 ratio (ref 0.0–3.6)
Triglycerides: 62 mg/dL (ref 0–149)
VLDL Cholesterol Cal: 12 mg/dL (ref 5–40)

## 2019-08-14 LAB — VITAMIN D 25 HYDROXY (VIT D DEFICIENCY, FRACTURES): Vit D, 25-Hydroxy: 49.2 ng/mL (ref 30.0–100.0)

## 2019-08-14 LAB — INSULIN, RANDOM: INSULIN: 5.9 u[IU]/mL (ref 2.6–24.9)

## 2019-09-10 ENCOUNTER — Encounter (INDEPENDENT_AMBULATORY_CARE_PROVIDER_SITE_OTHER): Payer: Self-pay | Admitting: Physician Assistant

## 2019-09-10 ENCOUNTER — Other Ambulatory Visit: Payer: Self-pay

## 2019-09-10 ENCOUNTER — Ambulatory Visit (INDEPENDENT_AMBULATORY_CARE_PROVIDER_SITE_OTHER): Payer: BC Managed Care – PPO | Admitting: Physician Assistant

## 2019-09-10 VITALS — BP 103/67 | HR 51 | Temp 97.8°F | Ht 77.0 in | Wt 228.0 lb

## 2019-09-10 DIAGNOSIS — E669 Obesity, unspecified: Secondary | ICD-10-CM

## 2019-09-10 DIAGNOSIS — E559 Vitamin D deficiency, unspecified: Secondary | ICD-10-CM | POA: Diagnosis not present

## 2019-09-10 DIAGNOSIS — E7849 Other hyperlipidemia: Secondary | ICD-10-CM

## 2019-09-10 DIAGNOSIS — Z9189 Other specified personal risk factors, not elsewhere classified: Secondary | ICD-10-CM

## 2019-09-10 DIAGNOSIS — Z6830 Body mass index (BMI) 30.0-30.9, adult: Secondary | ICD-10-CM

## 2019-09-10 MED ORDER — VITAMIN D (ERGOCALCIFEROL) 1.25 MG (50000 UNIT) PO CAPS: 50000.0000 [IU] | ORAL_CAPSULE | ORAL | 0 refills | Status: DC

## 2019-09-10 NOTE — Progress Notes (Signed)
Chief Complaint:   OBESITY Scott Hensley is here to discuss his progress with his obesity treatment plan along with follow-up of his obesity related diagnoses. [name] is on the Category 4 Plan and states he is following his eating plan approximately 95% of the time. Scott Hensley states he is doing 9 week [employer] 30-35 minutes 5 times per week.  Today's visit was #: 17 Starting weight: 284 lbs Starting date: 11/18/2018 Today's weight: 228 lbs Today's date: 09/10/2019 Total lbs lost to date: 56 Total lbs lost since last in-office visit: 3  Interim History: Scott Hensley states that his hunger is controlled with the exception of some late afternoons. He is exercising regularly.  Subjective:   Vitamin D deficiency. Scott Hensley is on prescription Vitamin D weekly. Last Vitamin D level was 49.2 on 08/13/2019.  Other hyperlipidemia. Scott Hensley is on atorvastatin. No chest pain or myalgias. Last level was at goal. He is exercising regularly.  Lab Results  Component Value Date   CHOL 166 08/13/2019   HDL 59 08/13/2019   LDLCALC 95 08/13/2019   TRIG 62 08/13/2019   CHOLHDL 3.7 06/28/2018   Lab Results  Component Value Date   ALT 27 08/13/2019   AST 23 08/13/2019   ALKPHOS 80 08/13/2019   BILITOT 0.6 08/13/2019   The 10-year ASCVD risk score Scott Hensley., et al., 2013) is: 0.9%   Values used to calculate the score:     Age: 49 years     Sex: Male     Is Non-Hispanic African American: No     Diabetic: No     Tobacco smoker: No     Systolic Blood Pressure: 628 mmHg     Is BP treated: No     HDL Cholesterol: 59 mg/dL     Total Cholesterol: 166 mg/dL  At risk for heart disease. Scott Hensley is at a higher than average risk for cardiovascular disease due to obesity.    Assessment/Plan:   Vitamin D deficiency. Low Vitamin D level contributes to fatigue and are associated with obesity, breast, and colon cancer. He was given a refill on his Vitamin D, Ergocalciferol, (DRISDOL) 1.25 MG (50000 UNIT) CAPS capsule  every week #4 with 0 refills and will follow-up for routine testing of Vitamin D, at least 2-3 times per year to avoid over-replacement.   Other hyperlipidemia. Cardiovascular risk and specific lipid/LDL goals reviewed.  We discussed several lifestyle modifications today and Scott Hensley will continue to work on diet, exercise and weight loss efforts. Orders and follow up as documented in patient record. He will continue his medication as directed.   Counseling Intensive lifestyle modifications are the first line treatment for this issue. . Dietary changes: Increase soluble fiber. Decrease simple carbohydrates. . Exercise changes: Moderate to vigorous-intensity aerobic activity 150 minutes per week if tolerated. . Lipid-lowering medications: see documented in medical record.  At risk for heart disease. Scott Hensley was given approximately 15 minutes of coronary artery disease prevention counseling today. He is 49 y.o. male and has risk factors for heart disease including obesity. We discussed intensive lifestyle modifications today with an emphasis on specific weight loss instructions and strategies.   Repetitive spaced learning was employed today to elicit superior memory formation and behavioral change.  Class 1 obesity with serious comorbidity and body mass index (BMI) of 30.0 to 30.9 in adult, unspecified obesity type - Starting BMI greater than 30.   Jas[place] is currently in the action stage of change. As such, his goal  is to continue with weight loss efforts. He has agreed to the Category 4 Plan.   Exercise goals: For substantial health benefits, adults should do at least 150 minutes (2 hours and 30 minutes) a week of moderate-intensity, or 75 minutes (1 hour and 15 minutes) a week of vigorous-intensity aerobic physical activity, or an equivalent combination of moderate- and vigorous-intensity aerobic activity. Aerobic activity should be performed in episodes of at least 10 minutes, and preferably, it should be  spread throughout the week.  Behavioral modification strategies: meal planning and cooking strategies and keeping healthy foods in the home.  [place] has agreed to follow-up with our clinic in 4 weeks. He was informed of the importance of frequent follow-up visits to maximize his success with intensive lifestyle modifications for his multiple health conditions.   Objective:   Blood pressure 103/67, pulse (!) 51, temperature 97.8 F (36.6 C), temperature source Oral, height 6\' 5"  (1.956 m), weight 228 lb (103.4 kg), SpO2 100 %. Body mass index is 27.04 kg/m.  General: Cooperative, alert, well developed, in no acute distress. HEENT: Conjunctivae and lids unremarkable. Cardiovascular: Regular rhythm.  Lungs: Normal work of breathing. Neurologic: No focal deficits.   Lab Results  Component Value Date   CREATININE 1.35 (H) 08/13/2019   BUN 18 08/13/2019   NA 141 08/13/2019   K 4.7 08/13/2019   CL 105 08/13/2019   CO2 24 08/13/2019   Lab Results  Component Value Date   ALT 27 08/13/2019   AST 23 08/13/2019   ALKPHOS 80 08/13/2019   BILITOT 0.6 08/13/2019   Lab Results  Component Value Date   HGBA1C 5.5 08/13/2019   HGBA1C 5.6 04/16/2019   HGBA1C 5.5 11/18/2018   HGBA1C 5.4 04/29/2018   Lab Results  Component Value Date   INSULIN 5.9 08/13/2019   INSULIN 6.6 04/16/2019   INSULIN 11.9 11/18/2018   Lab Results  Component Value Date   TSH 2.270 11/18/2018   Lab Results  Component Value Date   CHOL 166 08/13/2019   HDL 59 08/13/2019   LDLCALC 95 08/13/2019   TRIG 62 08/13/2019   CHOLHDL 3.7 06/28/2018   Lab Results  Component Value Date   WBC 4.8 04/29/2018   HGB 15.6 04/29/2018   HCT 45.8 04/29/2018   MCV 86 04/29/2018   PLT 269 04/29/2018   No results found for: IRON, TIBC, FERRITIN  Attestation Statements:   Reviewed by clinician on day of visit: allergies, medications, problem list, medical history, surgical history, family history, social history, and  previous encounter notes.  I01/22/2020, am acting as transcriptionist for Marianna Payment, PA-C   I have reviewed the above documentation for accuracy and completeness, and I agree with the above. Alois Cliche, PA-C

## 2019-09-21 NOTE — Progress Notes (Signed)
Chief Complaint  Patient presents with  . Annual Exam    nonfasting annual exam. No concerns. Did not have colonoscopy, Dr. Randa Evens retired and he will establish with new provider at [place].    Scott Hensley is a 49 y.o. male who presents for a complete physical.  He has the following concerns:  He continues under the care of medical weight management clinic. Hit a plateau recently, but "doubled down" and was able to get past it.  There has been some stress related to behavioral issues with their oldest kids, especially their daughter.  Hyperlipidemia:  He is compliant with taking atorvastatin 20mg  daily, tolerates without side effects. He is following a lowfat, low cholesterol diet. Lipids were at goal per last check last month. Lab Results  Component Value Date   CHOL 166 08/13/2019   HDL 59 08/13/2019   LDLCALC 95 08/13/2019   TRIG 62 08/13/2019   CHOLHDL 3.7 06/28/2018   History of vitamin D deficiency: Vitamin D-OH level was low at 9.9 in 04/2018. He continues to take 50,000 IU once a week through [facility] clinic. Last level was 49.2 in 08/2019 (and was also normal in 04/2019).    He previously reported intermittent issues with urinary hesitancy, stream isn't continuous (stops and then voids some more). This occurs intermittently, and resolves on its own (has seen urologist for this once in the past, didn't follow up because it resolved).  He denies dysuria, hematuria.  Denies further issues with ED. Denies further problems. No longer drinks much caffeine.  He saw GI last year for ongoing abdominal and back complaints, with constipation. He was prescribed robinul for abdominal pain (anti-spasmodic), which was helpful. His symptoms improved since taking metamucil and flax seed daily.  No longer taking flax seed, just using metamucil daily, and bowels are still regular, going daily. Gets discomfort (back, abd) much less often. Colonoscopy was suggested by GI when he saw them last year (related to  his complaints), not yet done. Since taking metamucil daily, he has regular bowel movements, but notes that he has a large amount of stool after having 3-4 beers once a week, "cleans me out" Occasionally gets gas pains in the L back, more if not walking as much. Hasn't used anti-spasmodics in a year.   Immunization History  Administered Date(s) Administered  . Influenza,inj,Quad PF,6+ Mos 04/29/2018, 01/13/2019  . Tdap 10/07/2014  Got COVID vaccines (Moderna)--will get 10/09/2014 dates.  Last colonoscopy: 09/2010 with Dr. 10/2010. Diverticulosis noted Per GI visit 06/2018, colonoscopy was recommended/suggested Last PSA:  Lab Results  Component Value Date   PSA1 0.6 09/19/2018   Dentist: twice a year, but past due (due to COVID). Ophtho: never Exercise: in week 7 of 9 week program.  Exercising 5 days/week (free weights, resistance bands, [place], abs), 30 minutes. Walks some (to pool and back, mowing yard).   PMH, PSH, SH and FH were reviewed and updated  Outpatient Encounter Medications as of 09/22/2019  Medication Sig  . atorvastatin (LIPITOR) 20 MG tablet TAKE 1 TABLET BY MOUTH EVERY DAY  . Psyllium (METAMUCIL PO) Take 2 Scoops by mouth daily.  . Vitamin D, Ergocalciferol, (DRISDOL) 1.25 MG (50000 UNIT) CAPS capsule Take 1 capsule (50,000 Units total) by mouth every 7 (seven) days.   No facility-administered encounter medications on file as of 09/22/2019.   No Known Allergies  ROS: The patient denies anorexia, fever, headaches, vision loss (trouble seeing close up), decreased hearing, ear pain, hoarseness, chest pain, palpitations, dizziness, syncope,  dyspnea on exertion, cough, swelling, nausea, vomiting, diarrhea, melena, hematochezia, indigestion/heartburn, hematuria, incontinence, erectile dysfunction, nocturia, weakened urine stream, dysuria, genital lesions, joint pains, numbness, tingling, weakness, tremor, suspicious skin lesions, depression, anxiety, abnormal bleeding/bruising, or  enlarged lymph nodes Some numbness to his right lateral thigh/hip; has had for a long time, only episodic with certain positions (watching TV in bed only) Constipation and abdominal pain improved. ED and urinary difficulty resolved. Very rare dysphagia, usually only with tuna fish (in package, dry). R shoulder--improved after PT, still has occasional pain with certain activities. Some trouble getting back to sleep if he wakes up, related to stress.   PHYSICAL EXAM:  BP (!) 100/58   Pulse 60   Ht 6\' 5"  (1.956 m)   Wt 234 lb 12.8 oz (106.5 kg)   BMI 27.84 kg/m   Wt Readings from Last 3 Encounters:  09/22/19 234 lb 12.8 oz (106.5 kg)  09/10/19 228 lb (103.4 kg)  08/13/19 231 lb (104.8 kg)   Wt 285# at CPE in 09/2018  General Appearance:  Alert, cooperative, no distress, appears stated age  Head:  Normocephalic, without obvious abnormality, atraumatic  Eyes:  PERRL, conjunctiva/corneas clear, EOM's intact, fundi  benign  Ears:  Normal TM's and external ear canals, nonobstructive cerumen on the left  Nose: Not examined (wearing mask due to COVID-19 pandemic)  Throat: Not examined (wearing mask due to COVID-19 pandemic)  Neck: Supple, no lymphadenopathy; thyroid: no enlargement/ tenderness/nodules; no carotidbruit or JVD  Back:  Spine nontender, no curvature, ROM normal, no CVA tenderness  Lungs:  Clear to auscultation bilaterally without wheezes, rales or ronchi; respirations unlabored  Chest Wall:  No tenderness or deformity  Heart:  Regular rate and rhythm, S1 and S2 normal, no murmur, rub or gallop  Breast Exam:  No chest wall tenderness, masses or gynecomastia  Abdomen:  Soft, non-tender, nondistended, normoactive bowel sounds,  no masses, no hepatosplenomegaly  Genitalia:  Normal male external genitalia without lesions. Testicles without masses. No inguinal hernias.  Rectal:  Normal sphincter tone, no masses or tenderness; guaiac  negative stool. External hemorrhoids, mildly inflamed, not bleeding. Prostate smooth, mildly enlarged, nontender, no nodules.  Extremities: No clubbing, cyanosis or edema  Pulses: 2+ and symmetric all extremities  Skin: Skin color, texture, turgor normal, no rashes. Tanned/slightly pink at posterior neck.  Lymph nodes: Cervical, supraclavicular, and axillary nodes normal  Neurologic: CNII-XII intact, normal strength, sensation and gait; reflexes 2+ and symmetric throughout  Psych: Normal mood, affect, hygiene and grooming.   ASSESSMENT/PLAN:  Annual physical exam  Pure hypercholesterolemia - adequately controlled on 20mg  atorvastatin daily  Vitamin D deficiency - adequately replaced on Rx therapy through [facility] clinic  BMI 27.0-27.9,adult - congratulated on weight loss thus far. Continue regular exercise, cut back on beer, cont f/u at [facility]   Labs all reviewed from [facility] clinic, done in 08/2019.  Cr up slightly, rest normal. Lab Results  Component Value Date   CREATININE 1.35 (H) 08/13/2019   Discussed avoidance of NSAIDs, staying well hydrated.  Discussed PSA screening (risks/benefits) briefly--age 75, recommended at least 30 minutes of aerobic activity at least 5 days/week; proper sunscreen use reviewed; healthy diet and alcohol recommendations (less than or equal to 2 drinks/day) reviewed; regular seatbelt use; changing batteries in smoke detectors.  Immunization recommendations discussed--yearly flu shots are recommended. Colonoscopy recommendations reviewed, UTD (due again next year), though recommended by GI last year due to his complaints. Since no longer having significant GI complaints, can hold off until  next year.  F/u 1 year, consider 6 month med check if no longer being seen at [facility] clinic.  No refills needed today.

## 2019-09-21 NOTE — Patient Instructions (Signed)
  HEALTH MAINTENANCE RECOMMENDATIONS:  It is recommended that you get at least 30 minutes of aerobic exercise at least 5 days/week (for weight loss, you may need as much as 60-90 minutes). This can be any activity that gets your heart rate up. This can be divided in 10-15 minute intervals if needed, but try and build up your endurance at least once a week.  Weight bearing exercise is also recommended twice weekly.  Eat a healthy diet with lots of vegetables, fruits and fiber.  "Colorful" foods have a lot of vitamins (ie green vegetables, tomatoes, red peppers, etc).  Limit sweet tea, regular sodas and alcoholic beverages, all of which has a lot of calories and sugar.  Up to 2 alcoholic drinks daily may be beneficial for men (unless trying to lose weight, watch sugars).  Drink a lot of water.  Sunscreen of at least SPF 30 should be used on all sun-exposed parts of the skin when outside between the hours of 10 am and 4 pm (not just when at beach or pool, but even with exercise, golf, tennis, and yard work!)  Use a sunscreen that says "broad spectrum" so it covers both UVA and UVB rays, and make sure to reapply every 1-2 hours.  Remember to change the batteries in your smoke detectors when changing your clock times in the spring and fall. Carbon monoxide detectors are recommended for your home.  Use your seat belt every time you are in a car, and please drive safely and not be distracted with cell phones and texting while driving.   Flu shots are recommended in the Fall. COVID vaccines are recommended if not already gotten.

## 2019-09-22 ENCOUNTER — Other Ambulatory Visit: Payer: Self-pay

## 2019-09-22 ENCOUNTER — Encounter: Payer: Self-pay | Admitting: Family Medicine

## 2019-09-22 ENCOUNTER — Ambulatory Visit (INDEPENDENT_AMBULATORY_CARE_PROVIDER_SITE_OTHER): Payer: BC Managed Care – PPO | Admitting: Family Medicine

## 2019-09-22 VITALS — BP 100/58 | HR 60 | Ht 77.0 in | Wt 234.8 lb

## 2019-09-22 DIAGNOSIS — E559 Vitamin D deficiency, unspecified: Secondary | ICD-10-CM | POA: Diagnosis not present

## 2019-09-22 DIAGNOSIS — Z6827 Body mass index (BMI) 27.0-27.9, adult: Secondary | ICD-10-CM

## 2019-09-22 DIAGNOSIS — E7800 Pure hypercholesterolemia, unspecified: Secondary | ICD-10-CM | POA: Diagnosis not present

## 2019-09-22 DIAGNOSIS — Z0000 Encounter for general adult medical examination without abnormal findings: Secondary | ICD-10-CM

## 2019-10-08 ENCOUNTER — Ambulatory Visit (INDEPENDENT_AMBULATORY_CARE_PROVIDER_SITE_OTHER): Payer: BC Managed Care – PPO | Admitting: Physician Assistant

## 2019-10-08 ENCOUNTER — Other Ambulatory Visit: Payer: Self-pay

## 2019-10-08 ENCOUNTER — Encounter (INDEPENDENT_AMBULATORY_CARE_PROVIDER_SITE_OTHER): Payer: Self-pay | Admitting: Physician Assistant

## 2019-10-08 VITALS — BP 108/61 | HR 53 | Temp 98.1°F | Ht 77.0 in | Wt 227.0 lb

## 2019-10-08 DIAGNOSIS — Z9189 Other specified personal risk factors, not elsewhere classified: Secondary | ICD-10-CM

## 2019-10-08 DIAGNOSIS — E669 Obesity, unspecified: Secondary | ICD-10-CM

## 2019-10-08 DIAGNOSIS — E7849 Other hyperlipidemia: Secondary | ICD-10-CM | POA: Diagnosis not present

## 2019-10-08 DIAGNOSIS — E559 Vitamin D deficiency, unspecified: Secondary | ICD-10-CM

## 2019-10-08 DIAGNOSIS — Z6830 Body mass index (BMI) 30.0-30.9, adult: Secondary | ICD-10-CM

## 2019-10-08 MED ORDER — VITAMIN D (ERGOCALCIFEROL) 1.25 MG (50000 UNIT) PO CAPS: 50000.0000 [IU] | ORAL_CAPSULE | ORAL | 0 refills | Status: DC

## 2019-10-08 NOTE — Progress Notes (Signed)
Chief Complaint:   OBESITY Scott Hensley is here to discuss his progress with his obesity treatment plan along with follow-up of his obesity related diagnoses. Reason is on the Category 4 Plan and states he is following his eating plan approximately 90% of the time. Scott Hensley states he is doing cardio/resistance 30 minutes 5 times per week.  Today's visit was #: 14 Starting weight: 284 lbs Starting date: 11/18/2018 Today's weight: 227 lbs Today's date: 10/08/2019 Total lbs lost to date: 57 Total lbs lost since last in-office visit: 1  Interim History: Scott Hensley reports that he ate slightly off plan with a trip to [place], his birthday, and Father's Day. He continues to enjoy the plan and denies excessive hunger. He is traveling to the beach in a few weeks.  Subjective:   Vitamin D deficiency. No nausea, vomiting, or muscle weakness on prescription Vitamin D.   Ref. Range 08/13/2019 11:44  Vitamin D, 25-Hydroxy Latest Ref Range: 30.0 - 100.0 ng/mL 49.2   Other hyperlipidemia. Scott Hensley is on atorvastatin. No chest pain or myalgias. He is exercising regularly.   Lab Results  Component Value Date   CHOL 166 08/13/2019   HDL 59 08/13/2019   LDLCALC 95 08/13/2019   TRIG 62 08/13/2019   CHOLHDL 3.7 06/28/2018   Lab Results  Component Value Date   ALT 27 08/13/2019   AST 23 08/13/2019   ALKPHOS 80 08/13/2019   BILITOT 0.6 08/13/2019   The 10-year ASCVD risk score Denman George DC Jr., et al., 2013) is: 1.2%   Values used to calculate the score:     Age: 49 years     Sex: Male     Is Non-Hispanic African American: No     Diabetic: No     Tobacco smoker: No     Systolic Blood Pressure: 108 mmHg     Is BP treated: No     HDL Cholesterol: 59 mg/dL     Total Cholesterol: 166 mg/dL  At risk for heart disease. Scott Hensley is at a higher than average risk for cardiovascular disease due to obesity.   Assessment/Plan:   Vitamin D deficiency. Low Vitamin D level contributes to fatigue and are associated  with obesity, breast, and colon cancer. He was given a refill on his Vitamin D, Ergocalciferol, (DRISDOL) 1.25 MG (50000 UNIT) CAPS capsule every week #4 with 0 refills and will follow-up for routine testing of Vitamin D, at least 2-3 times per year to avoid over-replacement.   Other hyperlipidemia. Cardiovascular risk and specific lipid/LDL goals reviewed.  We discussed several lifestyle modifications today and Scott Hensley will continue to work on diet, exercise and weight loss efforts. Orders and follow up as documented in patient record. Scott Hensley will continue his medication as directed.  Counseling Intensive lifestyle modifications are the first line treatment for this issue. . Dietary changes: Increase soluble fiber. Decrease simple carbohydrates. . Exercise changes: Moderate to vigorous-intensity aerobic activity 150 minutes per week if tolerated. . Lipid-lowering medications: see documented in medical record.  At risk for heart disease. Scott Hensley was given approximately 15 minutes of coronary artery disease prevention counseling today. He is 49 y.o. male and has risk factors for heart disease including obesity. We discussed intensive lifestyle modifications today with an emphasis on specific weight loss instructions and strategies.   Repetitive spaced learning was employed today to elicit superior memory formation and behavioral change.  Class 1 obesity with serious comorbidity and body mass index (BMI) of 30.0 to 30.9 in  adult, unspecified obesity type - Starting BMI greater than 30.   Scott Hensley is currently in the action stage of change. As such, his goal is to continue with weight loss efforts. He has agreed to the Category 4 Plan.   Exercise goals: For substantial health benefits, adults should do at least 150 minutes (2 hours and 30 minutes) a week of moderate-intensity, or 75 minutes (1 hour and 15 minutes) a week of vigorous-intensity aerobic physical activity, or an equivalent combination of moderate-  and vigorous-intensity aerobic activity. Aerobic activity should be performed in episodes of at least 10 minutes, and preferably, it should be spread throughout the week.  Behavioral modification strategies: travel eating strategies and planning for success.  Scott Hensley has agreed to follow-up with our clinic in 5 weeks. He was informed of the importance of frequent follow-up visits to maximize his success with intensive lifestyle modifications for his multiple health conditions.   Objective:   Blood pressure 108/61, pulse (!) 53, temperature 98.1 F (36.7 C), temperature source Oral, height 6\' 5"  (1.956 m), weight 227 lb (103 kg), SpO2 99 %. Body mass index is 26.92 kg/m.  General: Cooperative, alert, well developed, in no acute distress. HEENT: Conjunctivae and lids unremarkable. Cardiovascular: Regular rhythm.  Lungs: Normal work of breathing. Neurologic: No focal deficits.   Lab Results  Component Value Date   CREATININE 1.35 (H) 08/13/2019   BUN 18 08/13/2019   NA 141 08/13/2019   K 4.7 08/13/2019   CL 105 08/13/2019   CO2 24 08/13/2019   Lab Results  Component Value Date   ALT 27 08/13/2019   AST 23 08/13/2019   ALKPHOS 80 08/13/2019   BILITOT 0.6 08/13/2019   Lab Results  Component Value Date   HGBA1C 5.5 08/13/2019   HGBA1C 5.6 04/16/2019   HGBA1C 5.5 11/18/2018   HGBA1C 5.4 04/29/2018   Lab Results  Component Value Date   INSULIN 5.9 08/13/2019   INSULIN 6.6 04/16/2019   INSULIN 11.9 11/18/2018   Lab Results  Component Value Date   TSH 2.270 11/18/2018   Lab Results  Component Value Date   CHOL 166 08/13/2019   HDL 59 08/13/2019   LDLCALC 95 08/13/2019   TRIG 62 08/13/2019   CHOLHDL 3.7 06/28/2018   Lab Results  Component Value Date   WBC 4.8 04/29/2018   HGB 15.6 04/29/2018   HCT 45.8 04/29/2018   MCV 86 04/29/2018   PLT 269 04/29/2018   No results found for: IRON, TIBC, FERRITIN  Attestation Statements:   Reviewed by clinician on day of  visit: allergies, medications, problem list, medical history, surgical history, family history, social history, and previous encounter notes.  I01/22/2020, am acting as transcriptionist for Marianna Payment, PA-C   I have reviewed the above documentation for accuracy and completeness, and I agree with the above. Alois Cliche, PA-C

## 2019-11-12 ENCOUNTER — Encounter (INDEPENDENT_AMBULATORY_CARE_PROVIDER_SITE_OTHER): Payer: Self-pay | Admitting: Physician Assistant

## 2019-11-12 ENCOUNTER — Other Ambulatory Visit: Payer: Self-pay

## 2019-11-12 ENCOUNTER — Ambulatory Visit (INDEPENDENT_AMBULATORY_CARE_PROVIDER_SITE_OTHER): Payer: BC Managed Care – PPO | Admitting: Physician Assistant

## 2019-11-12 VITALS — BP 98/63 | HR 55 | Temp 98.2°F | Ht 77.0 in | Wt 231.0 lb

## 2019-11-12 DIAGNOSIS — E559 Vitamin D deficiency, unspecified: Secondary | ICD-10-CM | POA: Diagnosis not present

## 2019-11-12 DIAGNOSIS — E8881 Metabolic syndrome: Secondary | ICD-10-CM | POA: Diagnosis not present

## 2019-11-12 DIAGNOSIS — Z6830 Body mass index (BMI) 30.0-30.9, adult: Secondary | ICD-10-CM

## 2019-11-12 DIAGNOSIS — E669 Obesity, unspecified: Secondary | ICD-10-CM | POA: Diagnosis not present

## 2019-11-12 DIAGNOSIS — Z9189 Other specified personal risk factors, not elsewhere classified: Secondary | ICD-10-CM

## 2019-11-12 MED ORDER — VITAMIN D (ERGOCALCIFEROL) 1.25 MG (50000 UNIT) PO CAPS: 50000.0000 [IU] | ORAL_CAPSULE | ORAL | 0 refills | Status: DC

## 2019-11-12 NOTE — Progress Notes (Signed)
Chief Complaint:   Scott Hensley is here to discuss his progress with his Scott treatment plan along with follow-up of his Scott related diagnoses. Scott Hensley is on the Category 4 Plan and states he is following his eating plan approximately 75% of the time. Scott Hensley states he is exercising on the rowing machine/weights 30 minutes 3 times per week and working with weights 30 minutes 1-2 times per week.  Today's visit was #: 15 Starting weight: 284 lbs Starting date: 11/18/2018 Today's weight: 231 lbs Today's date: 11/12/2019 Total lbs lost to date: 53 Total lbs lost since last in-office visit: 0  Interim History: Scott Hensley just returned from vacation where he ate out every night and enjoyed alcoholic beverages. He also notes that he has had some tempting foods in the house recently. He is ready to get back on track.   Subjective:   Vitamin D deficiency. Scott Hensley is on prescription Vitamin D weekly, which he is tolerating well.    Ref. Range 08/13/2019 11:44  Vitamin D, 25-Hydroxy Latest Ref Range: 30.0 - 100.0 ng/mL 49.2   Insulin resistance. Scott Hensley has a diagnosis of insulin resistance based on his elevated fasting insulin level >5. He continues to work on diet and exercise to decrease his risk of diabetes. Scott Hensley is on no medication. No polyphagia. He is exercising regularly.  Lab Results  Component Value Date   INSULIN 5.9 08/13/2019   INSULIN 6.6 04/16/2019   INSULIN 11.9 11/18/2018   Lab Results  Component Value Date   HGBA1C 5.5 08/13/2019   At risk for diabetes mellitus. Scott Hensley is at higher than average risk for developing diabetes due to his Scott.   Assessment/Plan:   Vitamin D deficiency. Low Vitamin D level contributes to fatigue and are associated with Scott, breast, and colon cancer. He was given a refill on his Vitamin D, Ergocalciferol, (DRISDOL) 1.25 MG (50000 UNIT) CAPS capsule every week #4 with 0 refills and will follow-up for routine testing of Vitamin D, at least  2-3 times per year to avoid over-replacement.   Insulin resistance. Scott Hensley will continue to work on weight loss, exercise, and decreasing simple carbohydrates to help decrease the risk of diabetes. Scott Hensley agreed to follow-up with [place] as directed to closely monitor his progress.  At risk for diabetes mellitus. Scott Hensley was given approximately 15 minutes of diabetes education and counseling today. We discussed intensive lifestyle modifications today with an emphasis on weight loss as well as increasing exercise and decreasing simple carbohydrates in his diet. We also reviewed medication options with an emphasis on risk versus benefit of those discussed.   Repetitive spaced learning was employed today to elicit superior memory formation and behavioral change.  Class 1 Scott with serious comorbidity and body mass index (BMI) of 30.0 to 30.9 in adult, unspecified Scott type - BMI was greater than 30 at start of program.  Scott Hensley is currently in the action stage of change. As such, his goal is to continue with weight loss efforts. He has agreed to the Category 4 Plan.   Exercise goals: For substantial health benefits, adults should do at least 150 minutes (2 hours and 30 minutes) a week of moderate-intensity, or 75 minutes (1 hour and 15 minutes) a week of vigorous-intensity aerobic physical activity, or an equivalent combination of moderate- and vigorous-intensity aerobic activity. Aerobic activity should be performed in episodes of at least 10 minutes, and preferably, it should be spread throughout the week.  Behavioral modification strategies: decreasing liquid  calories and meal planning and cooking strategies.  Scott Hensley has agreed to follow-up with our clinic fasting in 4-5 weeks. He was informed of the importance of frequent follow-up visits to maximize his success with intensive lifestyle modifications for his multiple health conditions.   Objective:   Blood pressure 98/63, pulse (!) 55, temperature 98.2 F  (36.8 C), temperature source Oral, height 6\' 5"  (1.956 m), weight 231 lb (104.8 kg), SpO2 99 %. Body mass index is 27.39 kg/m.  General: Cooperative, alert, well developed, in no acute distress. HEENT: Conjunctivae and lids unremarkable. Cardiovascular: Regular rhythm.  Lungs: Normal work of breathing. Neurologic: No focal deficits.   Lab Results  Component Value Date   CREATININE 1.35 (H) 08/13/2019   BUN 18 08/13/2019   NA 141 08/13/2019   K 4.7 08/13/2019   CL 105 08/13/2019   CO2 24 08/13/2019   Lab Results  Component Value Date   ALT 27 08/13/2019   AST 23 08/13/2019   ALKPHOS 80 08/13/2019   BILITOT 0.6 08/13/2019   Lab Results  Component Value Date   HGBA1C 5.5 08/13/2019   HGBA1C 5.6 04/16/2019   HGBA1C 5.5 11/18/2018   HGBA1C 5.4 04/29/2018   Lab Results  Component Value Date   INSULIN 5.9 08/13/2019   INSULIN 6.6 04/16/2019   INSULIN 11.9 11/18/2018   Lab Results  Component Value Date   TSH 2.270 11/18/2018   Lab Results  Component Value Date   CHOL 166 08/13/2019   HDL 59 08/13/2019   LDLCALC 95 08/13/2019   TRIG 62 08/13/2019   CHOLHDL 3.7 06/28/2018   Lab Results  Component Value Date   WBC 4.8 04/29/2018   HGB 15.6 04/29/2018   HCT 45.8 04/29/2018   MCV 86 04/29/2018   PLT 269 04/29/2018   No results found for: IRON, TIBC, FERRITIN  Attestation Statements:   Reviewed by clinician on day of visit: allergies, medications, problem list, medical history, surgical history, family history, social history, and previous encounter notes.  I01/22/2020, am acting as transcriptionist for Marianna Payment, PA-C   I have reviewed the above documentation for accuracy and completeness, and I agree with the above. Alois Cliche, PA-C

## 2019-12-10 ENCOUNTER — Ambulatory Visit (INDEPENDENT_AMBULATORY_CARE_PROVIDER_SITE_OTHER): Payer: BC Managed Care – PPO | Admitting: Physician Assistant

## 2020-03-03 ENCOUNTER — Telehealth: Payer: Self-pay | Admitting: *Deleted

## 2020-03-03 NOTE — Telephone Encounter (Signed)
Typically this is only the case if a man harbors a yeast infection, usually under the foreskin (uncircumsized men).  They usually have redness and itching.  They are not usually asymptomatic carriers of yeast.  Some women just tend to get frequent yeast infections (even those who aren't sexually active).  I'm wondering if Scott Hensley has addressed this with Dr. Billy Coast.   I'd rather her discuss this with Dr. Billy Coast first  There probably is little harm in him taking diflucan, though the treatment for men may be different than the one pill for women; there may just not be benefit...  Please advise

## 2020-03-03 NOTE — Telephone Encounter (Signed)
Patient called and said Scott Hensley has been having a lot of issues with yeast infections lately. 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 He does not have symptoms but would be willing to take this medicatio if you think it would help, please advise-thanks.

## 2020-03-03 NOTE — Telephone Encounter (Signed)
Patient advised.

## 2020-04-27 IMAGING — CT CT ABDOMEN AND PELVIS WITH CONTRAST
1 of 3 series · 14 of 32 positions shown, 19 images · IV contrast (APPLIED)
Comparison: 11/08/2010

CLINICAL DATA: Left upper quadrant pain 2 months, constipation

EXAM:
CT ABDOMEN AND PELVIS WITH CONTRAST
TECHNIQUE: Multidetector CT imaging of the abdomen and pelvis was performed
using the standard protocol following bolus administration of
intravenous contrast.
CONTRAST:  125mL [id number] IOPAMIDOL ([id number]) INJECTION 61%,
additional oral enteric contrast

[Series 2: abd/pelvis w/cm · axial · 0.85mm/px · z∈[-514,-24]mm · 14 of 113 slices shown, 19 images]
[im 8/113  soft-tissue]
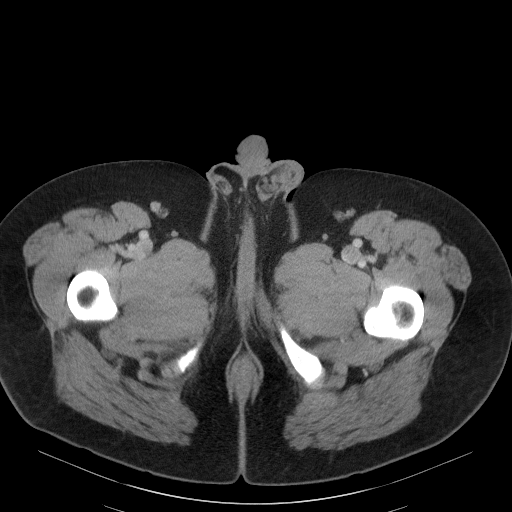
[im 8/113  bone]
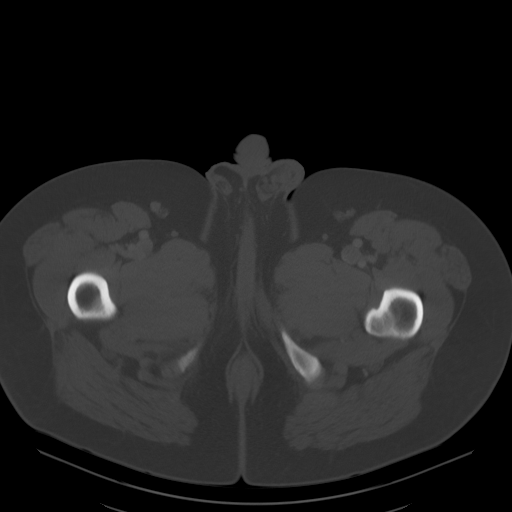
[im 15/113  soft-tissue]
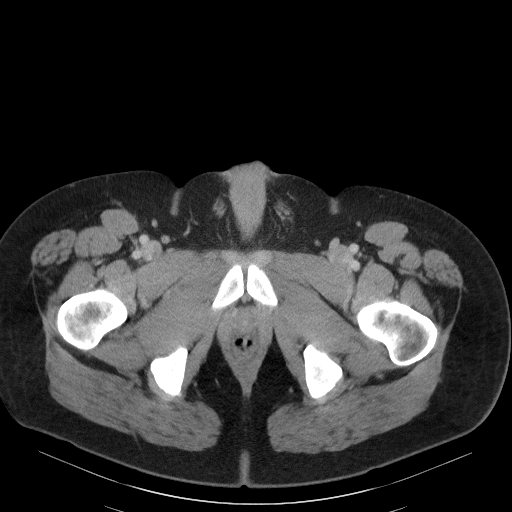
[im 22/113  soft-tissue]
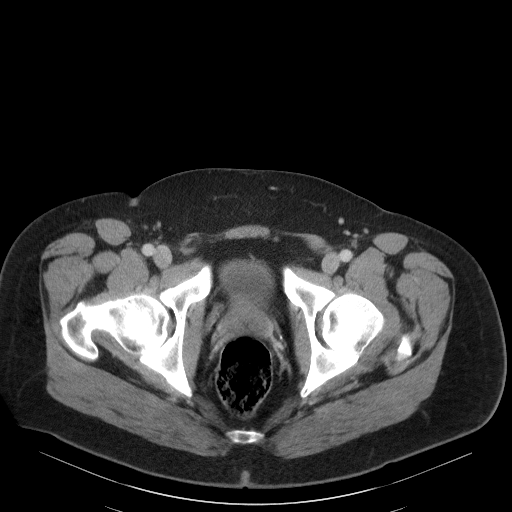
[im 36/113  soft-tissue]
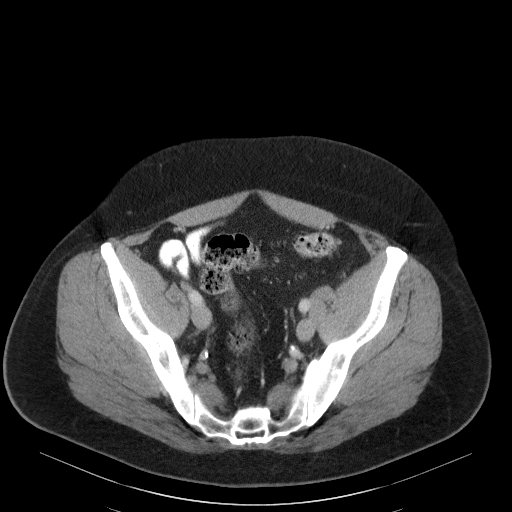
[im 43/113  soft-tissue]
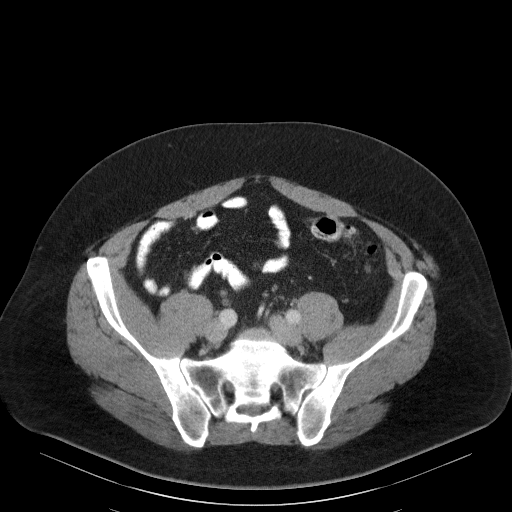
[im 50/113  soft-tissue]
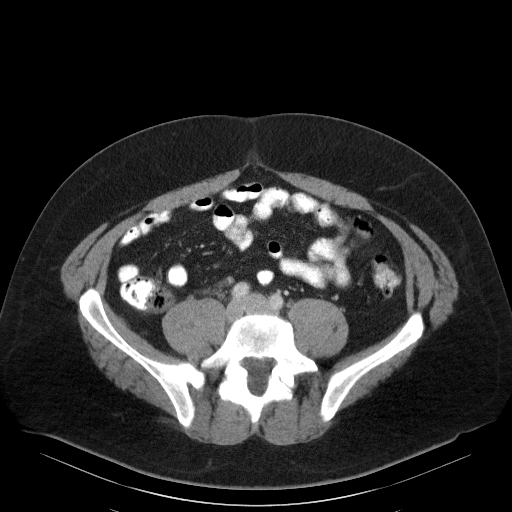
[im 57/113  soft-tissue]
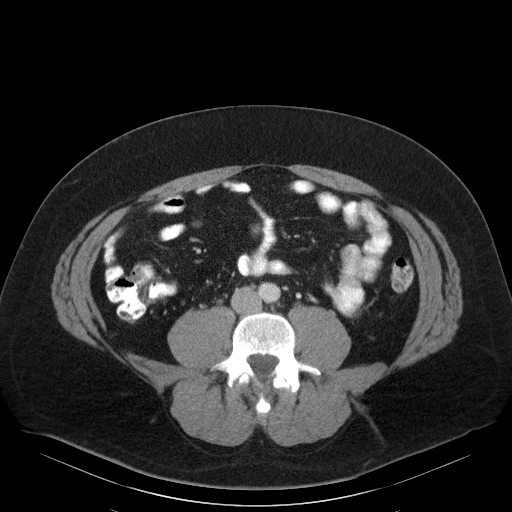
[im 64/113  soft-tissue]
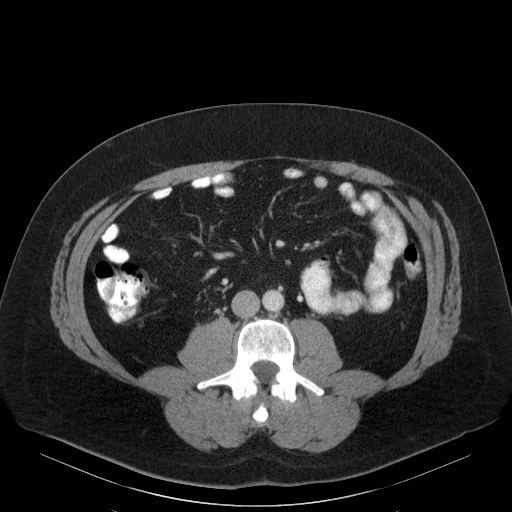
[im 71/113  soft-tissue]
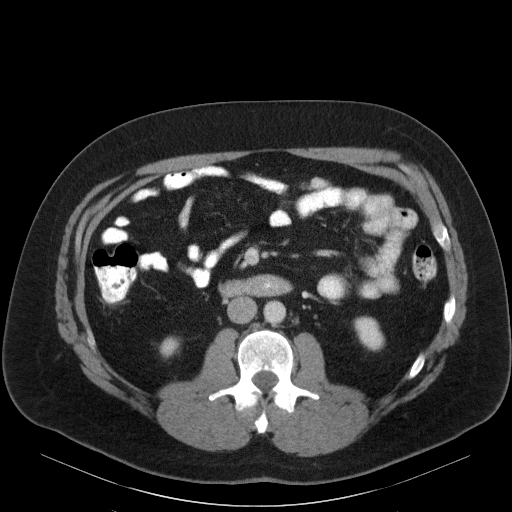
[im 71/113  bone]
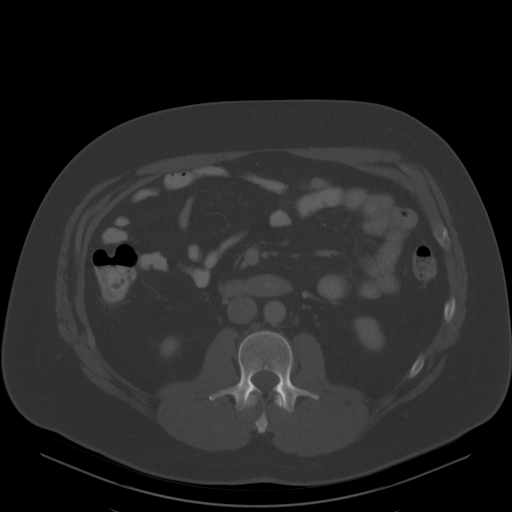
[im 78/113  soft-tissue]
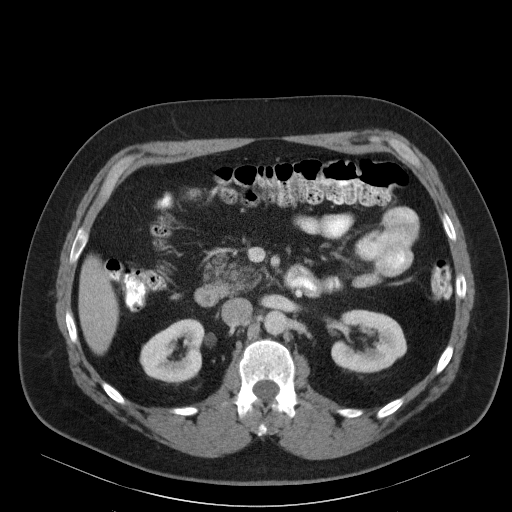
[im 85/113  lung]
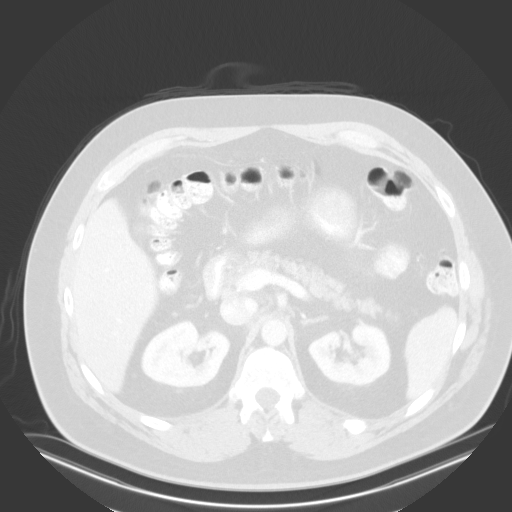
[im 92/113  soft-tissue]
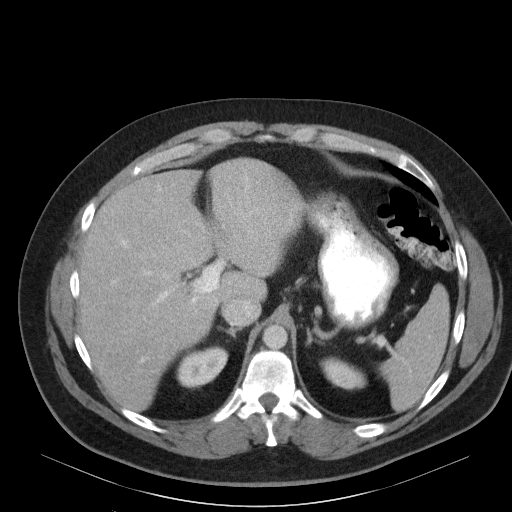
[im 92/113  lung]
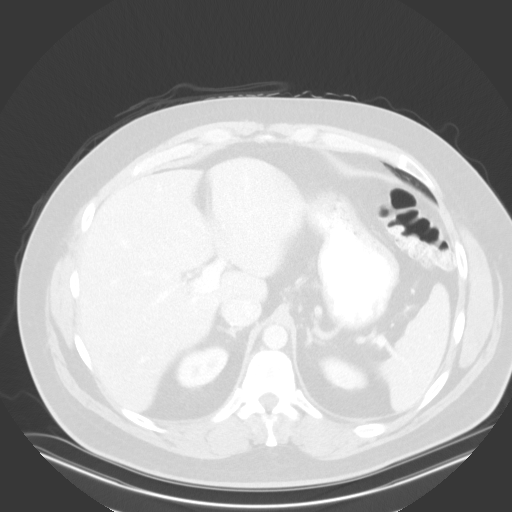
[im 99/113  soft-tissue]
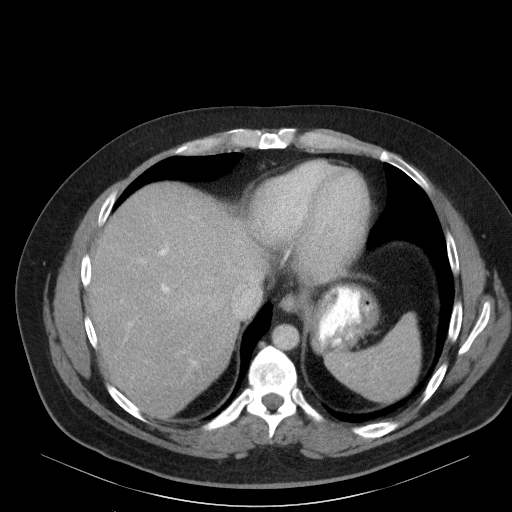
[im 99/113  lung]
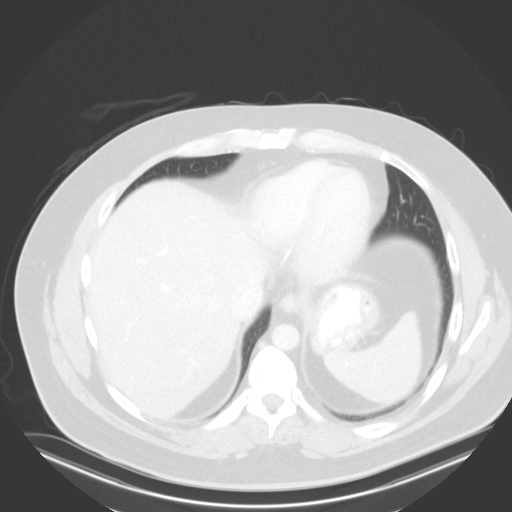
[im 106/113  soft-tissue]
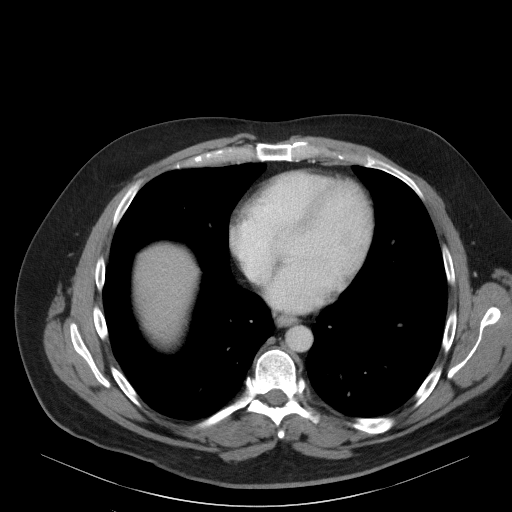
[im 106/113  lung]
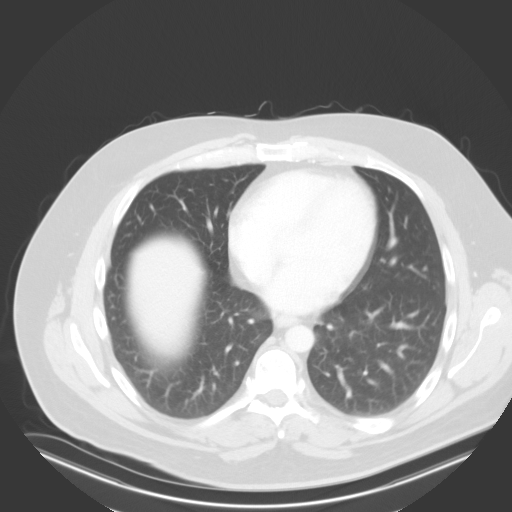

[14 of 32 positions shown; findings below may reference images not displayed]

FINDINGS: Lower chest: No acute abnormality.

Hepatobiliary: No focal liver abnormality is seen. No gallstones,
gallbladder wall thickening, or biliary dilatation.

Pancreas: Unremarkable. No pancreatic ductal dilatation or
surrounding inflammatory changes.

Spleen: Normal in size without focal abnormality.

Adrenals/Urinary Tract: Adrenal glands are unremarkable. Kidneys are
normal, without renal calculi, focal lesion, or hydronephrosis.
Bladder is unremarkable.

Stomach/Bowel: Stomach is within normal limits. Appendix appears
normal. No evidence of bowel wall thickening, distention, or
inflammatory changes. Sigmoid diverticulosis. Moderate burden of
stool in the colon.

Vascular/Lymphatic: No significant vascular findings are present. No
enlarged abdominal or pelvic lymph nodes.

Reproductive: No mass or other abnormality.

Other: No abdominal wall hernia or abnormality. No abdominopelvic
ascites.

Musculoskeletal: No acute or significant osseous findings.
IMPRESSION: No acute CT findings of the abdomen or pelvis to explain left upper
quadrant abdominal pain. There is sigmoid diverticulosis without
evidence of acute diverticulitis. Moderate burden of stool in the
colon.

## 2020-09-28 NOTE — Progress Notes (Signed)
Chief Complaint  Patient presents with   Annual Exam    Fasting annual exam, no concerns.    Scott Hensley is a 50 y.o. male who presents for a complete physical.   He has the following concerns:  He stopped going to medical weight management clinic in 11/2019. He has re-gained some of the weight he had lost. He reports he weighed 244# 2 weeks ago,  Still sticks to the same diet plan for breakfast and lunch. A little different for dinner. Exercise is sporadic--doing 9 week programs, but then will take a 6 week break. Some more beer recently. No longer drinking Michelob Ultra, back to IPA's, drinking 7/week since pool opened (3 beers once every 7-10d when out was prior usage, now is 7 once a week).  There has been some stress related to behavioral issues with their oldest kids, especially their daughter. She went to [place], ended up at [facility], suspects bipolar.  Also having issues with son, marijuana use, skipping school, poor grades.  They are both in counseling, planning family session with son. He has some trouble falling asleep, related to when kids are doing poorly. Negative depression screen today.  Hyperlipidemia:  He previously was taking atorvastatin 20mg  daily, tolerated without side effects, and lipids were at goal. He hasn't taken atorvastatin since he ran out (likely September) (Last rx was for #90 with 1 RF 06/2019) He is following a lowfat, low cholesterol diet. Eating mostly chicken.  1 egg and [place] sausage daily.  More beer recently.  Lipids were previously at goal on lipitor.  Lab Results  Component Value Date   CHOL 166 08/13/2019   HDL 59 08/13/2019   LDLCALC 95 08/13/2019   TRIG 62 08/13/2019   CHOLHDL 3.7 06/28/2018   History of vitamin D deficiency:  Vitamin D-OH level was low at 9.9 in 04/2018.  Last level was 49.2 in 08/2019, when taking 50,000 IU once a week through [facility] clinic.  He has not currently been taking any OTC vitamins or supplements.  He saw GI in  the past for abdominal and back complaints, with constipation. He was prescribed robinul for abdominal pain (anti-spasmodic), which was helpful. His symptoms improved after taking metamucil and flax seed daily, and has been doing fine with metamucil alone.  Bowels are regular.  Gets the discomfort in back infrequently, possibly after beers and poor eating.  Hasn't needed any anti-spasmodic in a very long time, still has some at home.    Immunization History  Administered Date(s) Administered   Influenza,inj,Quad PF,6+ Mos 04/29/2018, 01/13/2019   Tdap 10/07/2014  Got COVID vaccines (Moderna) x 2, no booster yet. Had a flu shot this year at [employer]. Last colonoscopy: 09/2010 with Dr. Oletta Lamas. Diverticulosis noted.  Lab Results  Component Value Date   PSA1 0.6 09/19/2018  Dentist: twice a year Ophtho: never; plans to go to eye doctor, notes more problems reading. Exercise:  Less exercise in the last month, but completed a 9 week beach-body program prior to that (free weights, resistance bands, [place], abs),.  Walks some (to pool and back, mowing yard).   PMH, PSH, SH and FH were reviewed and updated  Meds: metamucil daily, nothing else currently  No Known Allergies   ROS:  The patient denies anorexia, fever, headaches, decreased hearing, ear pain, hoarseness, chest pain, palpitations, dizziness, syncope, dyspnea on exertion, cough, swelling, nausea, vomiting, diarrhea, constipation, melena, hematochezia, indigestion/heartburn, hematuria, incontinence, erectile dysfunction, nocturia, weakened urine stream (resolved when stopped caffeine; rare  hesitancy only), dysuria, genital lesions, joint pains, numbness, tingling, weakness, tremor, suspicious skin lesions, depression, anxiety, abnormal bleeding/bruising, or enlarged lymph nodes Some numbness to his right lateral thigh/hip; has had for a long time, only episodic with certain positions (watching TV in bed only), along IT band.  Improves and  less often when stretching regularly. Some trouble seeing close up. Some trouble getting back to sleep if he wakes up, related to stress. Usually 6:30am, not in the middle of the night, able to fall back to sleep after bathroom trip. Some trouble falling asleep. Hemorrhoids flare once a year (with bleeding)    PHYSICAL EXAM:  BP 110/80   Pulse 72   Ht 6' 4.5" (1.943 m)   Wt 250 lb 9.6 oz (113.7 kg)   BMI 30.11 kg/m   Wt Readings from Last 3 Encounters:  09/29/20 250 lb 9.6 oz (113.7 kg)  11/12/19 231 lb (104.8 kg)  10/08/19 227 lb (103 kg)   Wt 285# in 09/2018  General Appearance:    Alert, cooperative, no distress, appears stated age  Head:    Normocephalic, without obvious abnormality, atraumatic  Eyes:    PERRL, conjunctiva/corneas clear, EOM's intact, fundi benign  Ears:    Normal TM's and external ear canals bilaterally. Partially obstructive cerumen noted on the left  Nose:  Not examined (wearing mask due to COVID-19 pandemic)  Throat:  Not examined (wearing mask due to COVID-19 pandemic)  Neck:   Supple, no lymphadenopathy;  thyroid: no enlargement/ tenderness/nodules; no carotid bruit or JVD  Back:    Spine nontender, no curvature, ROM normal, no CVA tenderness  Lungs:     Clear to auscultation bilaterally without wheezes, rales or  ronchi; respirations unlabored  Chest Wall:    No tenderness or deformity   Heart:    Regular rate and rhythm, S1 and S2 normal, no murmur, rub or gallop  Breast Exam:    No chest wall tenderness, masses or gynecomastia  Abdomen:     Soft, non-tender, nondistended, normoactive bowel sounds, no masses, no hepatosplenomegaly  Genitalia:    Normal male external genitalia without lesions. Testicles without masses.  No inguinal hernias.  Rectal:    Normal sphincter tone, no masses or tenderness; guaiac negative stool. Multiple inflamed external hemorrhoids, nontender, not bleeding. Prostate smooth, not enlarged, nontender, no nodules.  Extremities:    No clubbing, cyanosis or edema  Pulses:   2+ and symmetric all extremities  Skin:   Skin color, texture, turgor normal, no rashes. T  Lymph nodes:   Cervical, supraclavicular, and inguinal nodes normal  Neurologic:   Normal strength, sensation and gait; reflexes 2+ and symmetric throughout              Psych:   Normal mood, affect, hygiene and grooming.     ASSESSMENT/PLAN:  Annual physical exam - Plan: Lipid panel, Comprehensive metabolic panel, CBC with Differential/Platelet, VITAMIN D 25 Hydroxy (Vit-D Deficiency, Fractures), Hepatitis C antibody  Pure hypercholesterolemia - Plan: Lipid panel, atorvastatin (LIPITOR) 20 MG tablet  Vitamin D deficiency - expect will be low, due to noncompliance with supplements.  - Plan: VITAMIN D 25 Hydroxy (Vit-D Deficiency, Fractures), Vitamin D, Ergocalciferol, (DRISDOL) 1.25 MG (50000 UNIT) CAPS capsule  BMI 30.0-30.9,adult - counseled re: weight gain, diet, exercise  Need for COVID-19 vaccine - Plan: Moderna Covid-19 Booster  Need for hepatitis C screening test - Plan: Hepatitis C antibody  Insomnia, unspecified type - related to family stressors. Counseling provided  Medication monitoring  encounter   C-met, CBC, lipids, VIt D, HepC; declined HIV (screened 9 years ago, no risks)  Counseled re: stress reduction, sleeping. Discussed relaxation/visualization techniques to try to get back to sleep. Counseled re: risks of binge alcohol use, setting examples for kids, diet, exercise, risks of obesity.  F/u  labs in 3 months, after being back on lipitor and recheck D (may need rf of rx, if very low)  Discussed PSA screening starting at age 24, recommended at least 30 minutes of aerobic activity at least 5 days/week; proper sunscreen use reviewed; healthy diet and alcohol recommendations (less than or equal to 2 drinks/day) reviewed; regular seatbelt use; changing batteries in smoke detectors.  Immunization recommendations discussed--yearly flu  shots are recommended.  COVID booster given today, risks/SE reviewed. Colonoscopy recommendations reviewed, repeat colonoscopy is due now.  Patient to contact [facility] to schedule  F/u 1 year for CPE (but repeat labs in 3 mos)

## 2020-09-28 NOTE — Patient Instructions (Addendum)
  HEALTH MAINTENANCE RECOMMENDATIONS:  It is recommended that you get at least 30 minutes of aerobic exercise at least 5 days/week (for weight loss, you may need as much as 60-90 minutes). This can be any activity that gets your heart rate up. This can be divided in 10-15 minute intervals if needed, but try and build up your endurance at least once a week.  Weight bearing exercise is also recommended twice weekly.  Eat a healthy diet with lots of vegetables, fruits and fiber.  "Colorful" foods have a lot of vitamins (ie green vegetables, tomatoes, red peppers, etc).  Limit sweet tea, regular sodas and alcoholic beverages, all of which has a lot of calories and sugar.  Up to 2 alcoholic drinks daily may be beneficial for men (unless trying to lose weight, watch sugars).  Drink a lot of water.  Sunscreen of at least SPF 30 should be used on all sun-exposed parts of the skin when outside between the hours of 10 am and 4 pm (not just when at beach or pool, but even with exercise, golf, tennis, and yard work!)  Use a sunscreen that says "broad spectrum" so it covers both UVA and UVB rays, and make sure to reapply every 1-2 hours.  Remember to change the batteries in your smoke detectors when changing your clock times in the spring and fall.  Carbon monoxide detectors are recommended for your home.  Use your seat belt every time you are in a car, and please drive safely and not be distracted with cell phones and texting while driving.  Please call [facility] to schedule screening colonoscopy. Cut back on the quantity of beer (try and limit to 3 at a time at most).  I suspect your vitamin D will be very low.  The prescription is used to boost it up faster.  Ultimately you will need to take a daily vitamin D3 (1000 or 2000 IU). I would buy 2000 IU to have at home for when the prescription is completed.  We will recheck in 3 months to see if further prescription is needed or switch to the OTC at that time.  I  suspect your cholesterol will be high, being off the lipitor, and we will restart this, and check in 3 months.

## 2020-09-29 ENCOUNTER — Encounter: Payer: Self-pay | Admitting: Family Medicine

## 2020-09-29 ENCOUNTER — Ambulatory Visit (INDEPENDENT_AMBULATORY_CARE_PROVIDER_SITE_OTHER): Payer: BC Managed Care – PPO | Admitting: Family Medicine

## 2020-09-29 ENCOUNTER — Other Ambulatory Visit: Payer: Self-pay

## 2020-09-29 VITALS — BP 110/80 | HR 72 | Ht 76.5 in | Wt 250.6 lb

## 2020-09-29 DIAGNOSIS — E559 Vitamin D deficiency, unspecified: Secondary | ICD-10-CM

## 2020-09-29 DIAGNOSIS — G4700 Insomnia, unspecified: Secondary | ICD-10-CM

## 2020-09-29 DIAGNOSIS — Z23 Encounter for immunization: Secondary | ICD-10-CM | POA: Diagnosis not present

## 2020-09-29 DIAGNOSIS — Z6830 Body mass index (BMI) 30.0-30.9, adult: Secondary | ICD-10-CM

## 2020-09-29 DIAGNOSIS — Z0000 Encounter for general adult medical examination without abnormal findings: Secondary | ICD-10-CM

## 2020-09-29 DIAGNOSIS — E7800 Pure hypercholesterolemia, unspecified: Secondary | ICD-10-CM

## 2020-09-29 DIAGNOSIS — Z1159 Encounter for screening for other viral diseases: Secondary | ICD-10-CM

## 2020-09-29 DIAGNOSIS — Z5181 Encounter for therapeutic drug level monitoring: Secondary | ICD-10-CM

## 2020-09-30 LAB — COMPREHENSIVE METABOLIC PANEL
ALT: 32 IU/L (ref 0–44)
AST: 20 IU/L (ref 0–40)
Albumin/Globulin Ratio: 2 (ref 1.2–2.2)
Albumin: 4.9 g/dL (ref 4.0–5.0)
Alkaline Phosphatase: 67 IU/L (ref 44–121)
BUN/Creatinine Ratio: 13 (ref 9–20)
BUN: 16 mg/dL (ref 6–24)
Bilirubin Total: 0.7 mg/dL (ref 0.0–1.2)
CO2: 22 mmol/L (ref 20–29)
Calcium: 9.7 mg/dL (ref 8.7–10.2)
Chloride: 101 mmol/L (ref 96–106)
Creatinine, Ser: 1.22 mg/dL (ref 0.76–1.27)
Globulin, Total: 2.4 g/dL (ref 1.5–4.5)
Glucose: 99 mg/dL (ref 65–99)
Potassium: 4.3 mmol/L (ref 3.5–5.2)
Sodium: 138 mmol/L (ref 134–144)
Total Protein: 7.3 g/dL (ref 6.0–8.5)
eGFR: 74 mL/min/{1.73_m2} (ref >59)

## 2020-09-30 LAB — CBC WITH DIFFERENTIAL/PLATELET
Basophils Absolute: 0 10*3/uL (ref 0.0–0.2)
Basos: 1 %
EOS (ABSOLUTE): 0.2 10*3/uL (ref 0.0–0.4)
Eos: 4 %
Hematocrit: 45.7 % (ref 37.5–51.0)
Hemoglobin: 15.6 g/dL (ref 13.0–17.7)
Immature Grans (Abs): 0 10*3/uL (ref 0.0–0.1)
Immature Granulocytes: 0 %
Lymphocytes Absolute: 1.4 10*3/uL (ref 0.7–3.1)
Lymphs: 35 %
MCH: 29.8 pg (ref 26.6–33.0)
MCHC: 34.1 g/dL (ref 31.5–35.7)
MCV: 87 fL (ref 79–97)
Monocytes Absolute: 0.4 10*3/uL (ref 0.1–0.9)
Monocytes: 9 %
Neutrophils Absolute: 2 10*3/uL (ref 1.4–7.0)
Neutrophils: 51 %
Platelets: 207 10*3/uL (ref 150–450)
RBC: 5.23 x10E6/uL (ref 4.14–5.80)
RDW: 12.7 % (ref 11.6–15.4)
WBC: 4 10*3/uL (ref 3.4–10.8)

## 2020-09-30 LAB — LIPID PANEL
Chol/HDL Ratio: 3.6 ratio (ref 0.0–5.0)
Cholesterol, Total: 261 mg/dL — ABNORMAL HIGH (ref 100–199)
HDL: 73 mg/dL (ref >39)
LDL Chol Calc (NIH): 166 mg/dL — ABNORMAL HIGH (ref 0–99)
Triglycerides: 128 mg/dL (ref 0–149)
VLDL Cholesterol Cal: 22 mg/dL (ref 5–40)

## 2020-09-30 LAB — VITAMIN D 25 HYDROXY (VIT D DEFICIENCY, FRACTURES): Vit D, 25-Hydroxy: 25.7 ng/mL — ABNORMAL LOW (ref 30.0–100.0)

## 2020-09-30 LAB — HEPATITIS C ANTIBODY: Hep C Virus Ab: <0.1 s/co ratio (ref 0.0–0.9)

## 2020-09-30 MED ORDER — VITAMIN D (ERGOCALCIFEROL) 1.25 MG (50000 UNIT) PO CAPS: 50000.0000 [IU] | ORAL_CAPSULE | ORAL | 0 refills | Status: DC

## 2020-09-30 MED ORDER — ATORVASTATIN CALCIUM 20 MG PO TABS: 1.0000 | ORAL_TABLET | Freq: Every day | ORAL | 3 refills | Status: DC

## 2020-12-29 ENCOUNTER — Other Ambulatory Visit: Payer: Self-pay

## 2020-12-29 ENCOUNTER — Other Ambulatory Visit: Payer: 59

## 2020-12-29 DIAGNOSIS — E559 Vitamin D deficiency, unspecified: Secondary | ICD-10-CM

## 2020-12-29 DIAGNOSIS — Z5181 Encounter for therapeutic drug level monitoring: Secondary | ICD-10-CM

## 2020-12-29 DIAGNOSIS — E7800 Pure hypercholesterolemia, unspecified: Secondary | ICD-10-CM

## 2020-12-30 LAB — LIPID PANEL
Chol/HDL Ratio: 3.6 ratio (ref 0.0–5.0)
Cholesterol, Total: 201 mg/dL — ABNORMAL HIGH (ref 100–199)
HDL: 56 mg/dL (ref >39)
LDL Chol Calc (NIH): 127 mg/dL — ABNORMAL HIGH (ref 0–99)
Triglycerides: 102 mg/dL (ref 0–149)
VLDL Cholesterol Cal: 18 mg/dL (ref 5–40)

## 2020-12-30 LAB — HEPATIC FUNCTION PANEL
ALT: 44 IU/L (ref 0–44)
AST: 25 IU/L (ref 0–40)
Albumin: 4.7 g/dL (ref 4.0–5.0)
Alkaline Phosphatase: 66 IU/L (ref 44–121)
Bilirubin Total: 0.7 mg/dL (ref 0.0–1.2)
Bilirubin, Direct: 0.22 mg/dL (ref 0.00–0.40)
Total Protein: 7.1 g/dL (ref 6.0–8.5)

## 2020-12-30 LAB — VITAMIN D 25 HYDROXY (VIT D DEFICIENCY, FRACTURES): Vit D, 25-Hydroxy: 50.5 ng/mL (ref 30.0–100.0)

## 2021-03-16 ENCOUNTER — Ambulatory Visit: Payer: 59 | Admitting: Family Medicine

## 2021-03-16 ENCOUNTER — Other Ambulatory Visit: Payer: Self-pay

## 2021-03-16 ENCOUNTER — Encounter: Payer: Self-pay | Admitting: Family Medicine

## 2021-03-16 VITALS — BP 120/80 | HR 72 | Temp 97.1°F | Ht 76.5 in | Wt 271.8 lb

## 2021-03-16 DIAGNOSIS — R350 Frequency of micturition: Secondary | ICD-10-CM | POA: Diagnosis not present

## 2021-03-16 DIAGNOSIS — R3915 Urgency of urination: Secondary | ICD-10-CM

## 2021-03-16 DIAGNOSIS — N410 Acute prostatitis: Secondary | ICD-10-CM | POA: Diagnosis not present

## 2021-03-16 DIAGNOSIS — Z23 Encounter for immunization: Secondary | ICD-10-CM

## 2021-03-16 DIAGNOSIS — R300 Dysuria: Secondary | ICD-10-CM

## 2021-03-16 LAB — POCT URINALYSIS DIP (PROADVANTAGE DEVICE)
Bilirubin, UA: NEGATIVE
Blood, UA: NEGATIVE
Glucose, UA: NEGATIVE mg/dL
Ketones, POC UA: NEGATIVE mg/dL
Leukocytes, UA: NEGATIVE
Nitrite, UA: NEGATIVE
Protein Ur, POC: NEGATIVE mg/dL
Specific Gravity, Urine: 1.015
Urobilinogen, Ur: NEGATIVE
pH, UA: 7 (ref 5.0–8.0)

## 2021-03-16 MED ORDER — SULFAMETHOXAZOLE-TRIMETHOPRIM 800-160 MG PO TABS: 1.0000 | ORAL_TABLET | Freq: Two times a day (BID) | ORAL | 0 refills | Status: DC

## 2021-03-16 NOTE — Progress Notes (Signed)
Chief Complaint  Patient presents with   Urinary Frequency    Having some frequency and burning-also feels like he is not emptying completely. Has some urgency. Symptoms started in the last week and a half.    Urinary urgency, feeling like he isn't completely emptying.  Some slight hesitation to start the stream, but stream is strong, no dribbling. No pain with voiding, but has some burning after he finishes. Symptoms started about 10d ago.  Chart reviewed--he had similar episdoe 06/2014, treated for prostatitis with cipro.  History was almost identical.  He is in a monogamous relationship, with no concerns for STD's.  Wife has UTI, as does the dog.  Ran out of metamucil for 10d, had more constipation, and drank more soda in the last 2 weeks. No other changes.  PMH, PSH, SH reviewed  Outpatient Encounter Medications as of 03/16/2021  Medication Sig Note   Psyllium (METAMUCIL PO) Take 2 Scoops by mouth daily.    sulfamethoxazole-trimethoprim (BACTRIM DS) 800-160 MG tablet Take 1 tablet by mouth 2 (two) times daily.    atorvastatin (LIPITOR) 20 MG tablet Take 1 tablet (20 mg total) by mouth daily. (Patient not taking: Reported on 03/16/2021) 03/16/2021: Hasn't taken in couple of weeks   [DISCONTINUED] Vitamin D, Ergocalciferol, (DRISDOL) 1.25 MG (50000 UNIT) CAPS capsule Take 1 capsule (50,000 Units total) by mouth every 7 (seven) days.    No facility-administered encounter medications on file as of 03/16/2021.   No Known Allergies  ROS: no fever, chills, flank pain, N/V/D. Some mild recent constipation.  Urinary complaints per HPI.  Normal moods, no other complaints.   PHYSICAL EXAM:  BP 120/80   Pulse 72   Temp (!) 97.1 F (36.2 C) (Tympanic)   Ht 6' 4.5" (1.943 m)   Wt 271 lb 12.8 oz (123.3 kg)   BMI 32.65 kg/m    Well-appearing male, in no distress HEENT conjunctiva and sclera are clear, EOMI, wearing mask Neck: no lymphadenopathy Heart: regular rate and rhythm Lungs:  clear bilaterally Back: no CVA tenderness Abdomen: soft, nontender, no mass GU: normal penis, circ, no discharge or lesions. Normal testicles, no hernia, no inguinal LAD Rectal: normal sphincter tone.  Mild external hemorrhoids, nontender.   Prostate is just mildly enlarged, slightly more on the right. No nodules, not boggy. Psych: normal mood, affect, hygiene and grooming Neuro: alert and oriented, normal gait  Urine dip: SG 1.015, negative  ASSESSMENT/PLAN  Acute prostatitis - doubt STD; prostate not as boggy as with prior infx; normal urine. Reviewed UTD--to use Sulfa rather than Cipro - Plan: sulfamethoxazole-trimethoprim (BACTRIM DS) 800-160 MG tablet  Need for influenza vaccination - Plan: Flu Vaccine QUAD 6+ mos PF IM (Fluarix Quad PF)  Urinary frequency - Plan: POCT Urinalysis DIP (Proadvantage Device)  Urinary urgency - Plan: POCT Urinalysis DIP (Proadvantage Device)  Dysuria - Plan: GC/Chlamydia Probe Amp   Up to Date reviewed regarding antibiotic choice. Options include cipro and sulfa--will try Bactrim first (cipro if not responding). Doubt STD, but will send off

## 2021-03-17 LAB — GC/CHLAMYDIA PROBE AMP
Chlamydia trachomatis, NAA: NEGATIVE
Neisseria Gonorrhoeae by PCR: NEGATIVE

## 2021-03-31 ENCOUNTER — Encounter: Payer: Self-pay | Admitting: *Deleted

## 2021-10-02 NOTE — Progress Notes (Deleted)
No chief complaint on file.  Scott Hensley is a 51 y.o. male who presents for a complete physical.   He has the following concerns:  He was treated in December with Bactrim for prostatitis.  Last year--Some more beer recently. No longer drinking Michelob Ultra, back to IPA's, drinking 7/week since pool opened (3 beers once every 7-10d when out was prior usage, now is 7 once a week).  There has been some stress related to behavioral issues with their oldest kids, especially their daughter. She went to [place], ended up at [facility], suspects bipolar.  Also having issues with son, marijuana use, skipping school, poor grades.  They are both in counseling, planning family session with son. He has some trouble falling asleep, related to when kids are doing poorly.  Hyperlipidemia:  He is compliant with taking atorvastatin 20mg  daily, denies side effects. He is following a lowfat, low cholesterol diet. Eating mostly chicken.  1 egg and [place] sausage daily.   Lipids were at goal on last check.  Lab Results  Component Value Date   CHOL 201 (H) 12/29/2020   HDL 56 12/29/2020   LDLCALC 127 (H) 12/29/2020   TRIG 102 12/29/2020   CHOLHDL 3.6 12/29/2020   History of vitamin D deficiency:  Vitamin D-OH level was 50.5 in 12/2020 after Rx replacement (had been 25.2 in 09/2020).  Low at 9.9 in 04/2018.  He is currently taking  He saw GI in the past for abdominal and back complaints, with constipation. He was prescribed robinul for abdominal pain (anti-spasmodic), which was helpful. His symptoms improved after taking metamucil and flax seed daily, and has been doing fine with metamucil alone.  Bowels are regular.  Gets the discomfort in back infrequently, possibly after beers and poor eating.  Hasn't needed any anti-spasmodic in a very long time, still has some at home.    Immunization History  Administered Date(s) Administered   Influenza,inj,Quad PF,6+ Mos 04/29/2018, 01/13/2019, 03/16/2021    Influenza-Unspecified 01/09/2020   Moderna SARS-COV2 Booster Vaccination 09/29/2020   Moderna Sars-Covid-2 Vaccination 06/15/2019, 07/13/2019   Tdap 10/07/2014   Last colonoscopy: 09/2010 with Dr. Oletta Lamas. Diverticulosis noted.  Lab Results  Component Value Date   PSA1 0.6 09/19/2018  Dentist: twice a year Ophtho:  Exercise:   9 week beach-body programs (free weights, resistance bands, Tabata, abs),.  Walks some (to pool and back, mowing yard).   PMH, PSH, SH and FH were reviewed and updated  Meds: metamucil daily, nothing else currently  No Known Allergies   ROS:  The patient denies anorexia, fever, headaches, decreased hearing, ear pain, hoarseness, chest pain, palpitations, dizziness, syncope, dyspnea on exertion, cough, swelling, nausea, vomiting, diarrhea, constipation, melena, hematochezia, indigestion/heartburn, hematuria, incontinence, erectile dysfunction, nocturia, weakened urine stream (resolved when stopped caffeine; rare hesitancy only), dysuria, genital lesions, joint pains, numbness, tingling, weakness, tremor, suspicious skin lesions, depression, anxiety, abnormal bleeding/bruising, or enlarged lymph nodes Some numbness to his right lateral thigh/hip; has had for a long time, only episodic with certain positions (watching TV in bed only), along IT band.  Improves and less often when stretching regularly. Some trouble seeing close up. Some trouble getting back to sleep if he wakes up, related to stress. Usually 6:30am, not in the middle of the night, able to fall back to sleep after bathroom trip. Some trouble falling asleep. Hemorrhoids flare once a year (with bleeding)    PHYSICAL EXAM:  There were no vitals taken for this visit.  Wt Readings from Last  3 Encounters:  03/16/21 271 lb 12.8 oz (123.3 kg)  09/29/20 250 lb 9.6 oz (113.7 kg)  11/12/19 231 lb (104.8 kg)   Wt 285# in 09/2018  General Appearance:    Alert, cooperative, no distress, appears stated age   Head:    Normocephalic, without obvious abnormality, atraumatic  Eyes:    PERRL, conjunctiva/corneas clear, EOM's intact, fundi benign  Ears:    Normal TM's and external ear canals bilaterally.   Nose:  Normal, no drainage  Throat:  No lesions or erythema  Neck:   Supple, no lymphadenopathy;  thyroid: no enlargement/ tenderness/nodules; no carotid bruit or JVD  Back:    Spine nontender, no curvature, ROM normal, no CVA tenderness  Lungs:     Clear to auscultation bilaterally without wheezes, rales or  ronchi; respirations unlabored  Chest Wall:    No tenderness or deformity   Heart:    Regular rate and rhythm, S1 and S2 normal, no murmur, rub or gallop  Breast Exam:    No chest wall tenderness, masses or gynecomastia  Abdomen:     Soft, non-tender, nondistended, normoactive bowel sounds, no masses, no hepatosplenomegaly  Genitalia:    Normal male external genitalia without lesions. Testicles without masses.  No inguinal hernias.  Rectal:    Normal sphincter tone, no masses or tenderness; guaiac negative stool. Multiple inflamed external hemorrhoids, nontender, not bleeding. Prostate smooth, not enlarged, nontender, no nodules.  Extremities:   No clubbing, cyanosis or edema  Pulses:   2+ and symmetric all extremities  Skin:   Skin color, texture, turgor normal, no rashes. T  Lymph nodes:   Cervical, supraclavicular, and inguinal nodes normal  Neurologic:   Normal strength, sensation and gait; reflexes 2+ and symmetric throughout              Psych:   Normal mood, affect, hygiene and grooming.    Update hemorrhoids, prostate  ASSESSMENT/PLAN:    Did he ever get colonoscopy from [facility]? (Had visit with them in 04/2021, never got any colonoscopy report) Eye exam?   C-met, CBC, lipids, VIt D  RF atorvastatin after labs back x 1 year  Last year-- Counseled re: stress reduction, sleeping. Discussed relaxation/visualization techniques to try to get back to sleep. Counseled re: risks of  binge alcohol use, setting examples for kids, diet, exercise, risks of obesity.   Discussed PSA screening starting at age 11, recommended at least 30 minutes of aerobic activity at least 5 days/week; proper sunscreen use reviewed; healthy diet and alcohol recommendations (less than or equal to 2 drinks/day) reviewed; regular seatbelt use; changing batteries in smoke detectors.  Immunization recommendations discussed--yearly flu shots are recommended.  Updated bivalent COVID booster recommended when available in the Fall. Colonoscopy recommendations reviewed,    F/u 1 year for CPE

## 2021-10-03 ENCOUNTER — Encounter: Payer: BC Managed Care – PPO | Admitting: Family Medicine

## 2021-10-03 ENCOUNTER — Encounter: Payer: Self-pay | Admitting: Family Medicine

## 2021-10-03 ENCOUNTER — Telehealth: Payer: Self-pay | Admitting: *Deleted

## 2021-10-03 DIAGNOSIS — Z0000 Encounter for general adult medical examination without abnormal findings: Secondary | ICD-10-CM

## 2021-10-03 DIAGNOSIS — E7800 Pure hypercholesterolemia, unspecified: Secondary | ICD-10-CM

## 2021-10-03 DIAGNOSIS — E559 Vitamin D deficiency, unspecified: Secondary | ICD-10-CM

## 2021-10-03 NOTE — Telephone Encounter (Signed)
This patient no showed for their appointment today.Which of the following is necessary for this patient.   A) No follow-up necessary   B) Follow-up urgent. Locate Patient Immediately.   C) Follow-up necessary. Contact patient and Schedule visit in ____ Days.   D) Follow-up Advised. Contact patient and Schedule visit in ____ Days.   E) Please Send no show letter to patient. Charge no show fee if no show was a CPE.   

## 2021-11-16 ENCOUNTER — Encounter (INDEPENDENT_AMBULATORY_CARE_PROVIDER_SITE_OTHER): Payer: Self-pay

## 2021-12-14 ENCOUNTER — Encounter: Payer: Self-pay | Admitting: Internal Medicine

## 2022-01-17 ENCOUNTER — Encounter: Payer: Self-pay | Admitting: Internal Medicine

## 2022-05-25 ENCOUNTER — Ambulatory Visit (INDEPENDENT_AMBULATORY_CARE_PROVIDER_SITE_OTHER): Payer: 59 | Admitting: Family Medicine

## 2022-05-25 ENCOUNTER — Encounter: Payer: Self-pay | Admitting: Family Medicine

## 2022-05-25 VITALS — BP 110/70 | HR 76 | Temp 97.1°F | Ht 76.0 in | Wt 291.4 lb

## 2022-05-25 DIAGNOSIS — E7800 Pure hypercholesterolemia, unspecified: Secondary | ICD-10-CM

## 2022-05-25 DIAGNOSIS — H00025 Hordeolum internum left lower eyelid: Secondary | ICD-10-CM

## 2022-05-25 DIAGNOSIS — E559 Vitamin D deficiency, unspecified: Secondary | ICD-10-CM

## 2022-05-25 DIAGNOSIS — Z6835 Body mass index (BMI) 35.0-35.9, adult: Secondary | ICD-10-CM

## 2022-05-25 DIAGNOSIS — H00036 Abscess of eyelid left eye, unspecified eyelid: Secondary | ICD-10-CM | POA: Diagnosis not present

## 2022-05-25 DIAGNOSIS — Z5181 Encounter for therapeutic drug level monitoring: Secondary | ICD-10-CM

## 2022-05-25 DIAGNOSIS — Z0000 Encounter for general adult medical examination without abnormal findings: Secondary | ICD-10-CM

## 2022-05-25 MED ORDER — AMOXICILLIN-POT CLAVULANATE 875-125 MG PO TABS: 1.0000 | ORAL_TABLET | Freq: Two times a day (BID) | ORAL | 0 refills | Status: DC

## 2022-05-25 MED ORDER — ATORVASTATIN CALCIUM 20 MG PO TABS: 20.0000 mg | ORAL_TABLET | Freq: Every day | ORAL | 0 refills | Status: DC

## 2022-05-25 NOTE — Patient Instructions (Signed)
Restart your atorvastatin. Restart a vitamin D supplement (1000 IU daily).  Consider using a pillbox to help you remember to take these.  Restart diet plan that worked in the pas for you. Keep up the regular exercise.  Continue the erythromycin ointment at bedtime (just until the stye looks better). Take the antibiotic for a full week. Continue warm compresses. You may need to be re-evaluated if the redness and swelling continues to spread, despite this treatment.

## 2022-05-25 NOTE — Progress Notes (Signed)
Chief Complaint  Patient presents with   Stye    Has been on bottom left eyelid x 5 days. Had a head on it and it popped yesterday. Seems to be spreading and his cheek is tender now. Has been doing warm compresses and used e-mycin ointment last night. Also if he has been sitting in the car for longer than 20 min has a pressure between testicles and rectum. Called and has appt with GI in March but did want to mention to get your thoughts.    Refill    Needs refill on lipitor, has not had lipids since 12/2020. But has been taking in the last 6 weeks. Is going to schedule CPE when he checks out today.   Swelling and pain to L lower eyelid x 5 days. He was applying warm compresses. He noticed a "head" on the inside. Yesterday the pain went down into his cheek, and spread up and around to his L temple. Large amount of whitish-yellow drainage came out (thick), yesterday afternoon. No longer has pain to the temple, still has discomfort in his upper left cheek, a lot of pressure. The swelling and discoloration below the eye has improved.   His wife brought home some erythromycin ointment, used one dose last night.  He hasn't had any fever. Eye feels a little "burny", r/b ibuprofen. There was a lot of pain yesterday, before it started draining (at the lid), otherwise no eye pain. He had increased pain when leaning forward (prior to draining).  Vision is fine, slightly blurry, thinks there might still be some draining.  Hyperlipidemia:  he is requesting refill of atorvastatin.  He missed his CPE in June, hasn't rescheduled yet. Lipitor was rx'd x 1 year 09/2020--in the last 6 weeks he reported still taking some (had a few pills left from old rx), sporadically.    Obesity--he states he had gotten down to 225, but weight is back up, usually 260-270. His wife is on Wegovy, isn't hungry.  He isn't eating the best. Recently went back to exercising, and is hoping for walking 5 miles/day for [place] He admits  heavier alcohol intake after his daughter moved back from school (had been at [employer], wasn't going to classes, not taking her psych meds).  She is now taking classes at [employer] (son also), is out of the house more, which helps some.  Very stressful having her back home.   PMH, PSH, SH reviewed  Outpatient Encounter Medications as of 05/25/2022  Medication Sig Note   Psyllium (METAMUCIL PO) Take 2 Scoops by mouth daily.    atorvastatin (LIPITOR) 20 MG tablet Take 1 tablet (20 mg total) by mouth daily.    [DISCONTINUED] atorvastatin (LIPITOR) 20 MG tablet Take 1 tablet (20 mg total) by mouth daily. (Patient not taking: Reported on 03/16/2021) 05/25/2022: Ran out   [DISCONTINUED] sulfamethoxazole-trimethoprim (BACTRIM DS) 800-160 MG tablet Take 1 tablet by mouth 2 (two) times daily.    No facility-administered encounter medications on file as of 05/25/2022.   No Known Allergies  ROS: no fever, chills, vision changes, URI symptoms, GI complaints.  +stress, moods improved some. No other complaints.  PHYSICAL EXAM:  BP 110/70   Pulse 76   Temp (!) 97.1 F (36.2 C) (Tympanic)   Ht 6' 4"$  (1.93 m)   Wt 291 lb 6.4 oz (132.2 kg)   BMI 35.47 kg/m   Wt Readings from Last 3 Encounters:  05/25/22 291 lb 6.4 oz (132.2 kg)  03/16/21 271 lb 12.8  oz (123.3 kg)  09/29/20 250 lb 9.6 oz (113.7 kg)   Pleasant, well-appearing male, in no distress HEENT: conjunctiva and sclera are clear bilaterally, EOMI. Medial left lower lid has 1cm area of focal swelling, with erythema.  Slight spread of erythema onto the lower lid, none extending to face/cheek, no soft tissue swelling of face. No purulence noted in eye, or active drainage. Nontender at cheek, temples. Neuro: alert and oriented, cranial nerves grossly intact.   ASSESSMENT/PLAN:  Hordeolum internum of left lower eyelid - started draining yesterday. Cont warm compresses.  Erythro ointment at night.  Treat cellulitis as well, f/u if not  resolving  Cellulitis of eyelid, left - start augmentin.  To f/u immediately if increasing soft tissue swelling, redness, eye pain, fever - Plan: amoxicillin-clavulanate (AUGMENTIN) 875-125 MG tablet  Vitamin D deficiency - to restart daily D supplement. Discussed methods to help remember/be more compliant - Plan: VITAMIN D 25 Hydroxy (Vit-D Deficiency, Fractures)  Pure hypercholesterolemia - Noncompliant.  Discussed methods to help remember/be more compliant. Send in 3 mos; needs CPE/labs scheduled - Plan: atorvastatin (LIPITOR) 20 MG tablet, Comprehensive metabolic panel  BMI 0000000 - counseled re: diet, exercise, risks.  Seems motivated to eat better, just started exercise.  To cut back on alcohol  Annual physical exam - Plan: CBC with Differential/Platelet, Lipid panel, VITAMIN D 25 Hydroxy (Vit-D Deficiency, Fractures), Comprehensive metabolic panel  Medication monitoring encounter - Plan: Lipid panel, VITAMIN D 25 Hydroxy (Vit-D Deficiency, Fractures), Comprehensive metabolic panel   F/u within 3 months for CPE with labs prior.   Restart your atorvastatin. Restart a vitamin D supplement (1000 IU daily).  Consider using a pillbox to help you remember to take these.  Restart diet plan that worked in the pas for you. Keep up the regular exercise.  Continue the erythromycin ointment at bedtime (just until the stye looks better). Take the antibiotic for a full week. Continue warm compresses. You may need to be re-evaluated if the redness and swelling continues to spread, despite this treatment.

## 2022-07-12 LAB — HM COLONOSCOPY

## 2022-07-13 LAB — HM COLONOSCOPY

## 2022-07-26 ENCOUNTER — Encounter: Payer: Self-pay | Admitting: Family Medicine

## 2022-07-26 ENCOUNTER — Encounter: Payer: Self-pay | Admitting: *Deleted

## 2022-07-26 DIAGNOSIS — D126 Benign neoplasm of colon, unspecified: Secondary | ICD-10-CM | POA: Insufficient documentation

## 2022-08-03 ENCOUNTER — Other Ambulatory Visit: Payer: 59

## 2022-08-03 DIAGNOSIS — Z0000 Encounter for general adult medical examination without abnormal findings: Secondary | ICD-10-CM

## 2022-08-03 DIAGNOSIS — E559 Vitamin D deficiency, unspecified: Secondary | ICD-10-CM

## 2022-08-03 DIAGNOSIS — E7800 Pure hypercholesterolemia, unspecified: Secondary | ICD-10-CM

## 2022-08-03 DIAGNOSIS — Z5181 Encounter for therapeutic drug level monitoring: Secondary | ICD-10-CM

## 2022-08-03 LAB — CBC WITH DIFFERENTIAL/PLATELET
Eos: 3 %
Immature Granulocytes: 0 %
MCHC: 33.5 g/dL (ref 31.5–35.7)
MCV: 88 fL (ref 79–97)
Monocytes Absolute: 0.6 10*3/uL (ref 0.1–0.9)
Neutrophils Absolute: 2.4 10*3/uL (ref 1.4–7.0)
Platelets: 206 10*3/uL (ref 150–450)
RBC: 4.94 x10E6/uL (ref 4.14–5.80)

## 2022-08-03 LAB — LIPID PANEL
Chol/HDL Ratio: 3.5 ratio (ref 0.0–5.0)
Cholesterol, Total: 166 mg/dL (ref 100–199)
LDL Chol Calc (NIH): 101 mg/dL — ABNORMAL HIGH (ref 0–99)

## 2022-08-04 LAB — CBC WITH DIFFERENTIAL/PLATELET
Basophils Absolute: 0 10*3/uL (ref 0.0–0.2)
Basos: 1 %
EOS (ABSOLUTE): 0.1 10*3/uL (ref 0.0–0.4)
Hematocrit: 43.6 % (ref 37.5–51.0)
Hemoglobin: 14.6 g/dL (ref 13.0–17.7)
Immature Grans (Abs): 0 10*3/uL (ref 0.0–0.1)
Lymphocytes Absolute: 1.6 10*3/uL (ref 0.7–3.1)
Lymphs: 34 %
MCH: 29.6 pg (ref 26.6–33.0)
Monocytes: 12 %
Neutrophils: 50 %
RDW: 12.9 % (ref 11.6–15.4)
WBC: 4.8 10*3/uL (ref 3.4–10.8)

## 2022-08-04 LAB — COMPREHENSIVE METABOLIC PANEL
ALT: 60 IU/L — ABNORMAL HIGH (ref 0–44)
AST: 32 IU/L (ref 0–40)
Albumin/Globulin Ratio: 1.9 (ref 1.2–2.2)
Albumin: 4.3 g/dL (ref 4.1–5.1)
Alkaline Phosphatase: 72 IU/L (ref 44–121)
BUN/Creatinine Ratio: 15 (ref 9–20)
BUN: 18 mg/dL (ref 6–24)
Bilirubin Total: 0.5 mg/dL (ref 0.0–1.2)
CO2: 20 mmol/L (ref 20–29)
Calcium: 9.5 mg/dL (ref 8.7–10.2)
Chloride: 103 mmol/L (ref 96–106)
Creatinine, Ser: 1.18 mg/dL (ref 0.76–1.27)
Globulin, Total: 2.3 g/dL (ref 1.5–4.5)
Glucose: 99 mg/dL (ref 70–99)
Potassium: 4.4 mmol/L (ref 3.5–5.2)
Sodium: 139 mmol/L (ref 134–144)
Total Protein: 6.6 g/dL (ref 6.0–8.5)
eGFR: 76 mL/min/{1.73_m2} (ref >59)

## 2022-08-04 LAB — LIPID PANEL
HDL: 47 mg/dL (ref >39)
Triglycerides: 95 mg/dL (ref 0–149)
VLDL Cholesterol Cal: 18 mg/dL (ref 5–40)

## 2022-08-04 LAB — VITAMIN D 25 HYDROXY (VIT D DEFICIENCY, FRACTURES): Vit D, 25-Hydroxy: 38.2 ng/mL (ref 30.0–100.0)

## 2022-08-08 NOTE — Progress Notes (Unsigned)
No chief complaint on file.  Scott Hensley is a 52 y.o. male who presents for a complete physical.  See below for labs done prior to visit. He has the following concerns:  Obesity--he states he had gotten down to 225, but weight was noted to be back up at his last visit in February, running 260-270. His wife is on Wegovy, isn't hungry.  He reported he wasn't eating the best. At that time, he had just gotten back to exercising, and had wanted to walk 5 miles/day for [place]. He had reported heavier alcohol intake after his daughter moved back from school (had been at [employer], wasn't going to classes, not taking her psych meds).  She is now taking classes at [employer] (son is also). It has been very stressful having her back home. (He previously had gone to [employer], stopped in 11/2019)   Hyperlipidemia:  He is compliant with taking atorvastatin 20mg  daily since his last visit (05/25/22)  He denies side effects. He is following a lowfat, low cholesterol diet. Eating mostly chicken.  1 egg and [place] sausage daily.   History of vitamin D deficiency:  Vitamin D-OH level was low at 9.9 in 04/2018.  It was low again in 09/2020 when not taking supplements, improved to 50.5 after a prescription course. He is currently taking  Component Ref Range & Units 1 yr ago (12/29/20) 1 yr ago (09/29/20) 2 yr ago (08/13/19) 3 yr ago (04/16/19) 3 yr ago (09/19/18) 4 yr ago (04/29/18)  Vit D, 25-Hydroxy 30.0 - 100.0 ng/mL 50.5 25.7 Low  CM 49.2 CM 44.5 CM 30.4 CM 9.9 Low  CM    H/o prostatitis in 03/2021.  He denies any urinary/prostate complaints.  He saw GI in the past for abdominal and back complaints, with constipation. He was prescribed robinul for abdominal pain (anti-spasmodic), which was helpful. His symptoms improved after taking metamucil and flax seed daily, and has been doing fine with metamucil alone.  Bowels are regular.  Gets the discomfort in back infrequently, possibly after beers and poor eating.  Hasn't needed any  anti-spasmodic in a very long time, still has some at home.   Immunization History  Administered Date(s) Administered   Influenza,inj,Quad PF,6+ Mos 04/29/2018, 01/13/2019, 03/16/2021   Influenza-Unspecified 01/09/2020   Moderna SARS-COV2 Booster Vaccination 09/29/2020   Moderna Sars-Covid-2 Vaccination 06/15/2019, 07/13/2019   Tdap 10/07/2014   Last colonoscopy: 07/2022--tubular adenoma and hyperplastic polyp. 5 yr f/u rec (Dr. Randa Evens). Prior was 09/2010, diverticulosis noted.  Lab Results  Component Value Date   PSA1 0.6 09/19/2018  Dentist: twice a year Ophtho: never; plans to go to eye doctor, notes more problems reading. UPDATE Exercise:      PMH, PSH, SH and FH were reviewed and updated   ROS:  The patient denies anorexia, fever, headaches, decreased hearing, ear pain, hoarseness, chest pain, palpitations, dizziness, syncope, dyspnea on exertion, cough, swelling, nausea, vomiting, diarrhea, constipation, melena, hematochezia, indigestion/heartburn, hematuria, incontinence, erectile dysfunction, nocturia, weakened urine stream (resolved when stopped caffeine; rare hesitancy only), dysuria, genital lesions, joint pains, numbness, tingling, weakness, tremor, suspicious skin lesions, depression, anxiety, abnormal bleeding/bruising, or enlarged lymph nodes   Some trouble seeing close up. Some trouble getting back to sleep if he wakes up, related to stress. Usually 6:30am, not in the middle of the night, able to fall back to sleep after bathroom trip. Some trouble falling asleep. Hemorrhoids flare once a year (with bleeding)    PHYSICAL EXAM:  There were no vitals taken for  this visit.  Wt Readings from Last 3 Encounters:  05/25/22 291 lb 6.4 oz (132.2 kg)  03/16/21 271 lb 12.8 oz (123.3 kg)  09/29/20 250 lb 9.6 oz (113.7 kg)   Wt 285# in 09/2018  General Appearance:    Alert, cooperative, no distress, appears stated age  Head:    Normocephalic, without obvious abnormality,  atraumatic  Eyes:    PERRL, conjunctiva/corneas clear, EOM's intact, fundi benign  Ears:    Normal TM's and external ear canals bilaterally.   Nose:  No drainage or sinus tenderness  Throat:  Normal mucosa  Neck:   Supple, no lymphadenopathy;  thyroid: no enlargement/ tenderness/nodules; no carotid bruit or JVD  Back:    Spine nontender, no curvature, ROM normal, no CVA tenderness  Lungs:     Clear to auscultation bilaterally without wheezes, rales or  ronchi; respirations unlabored  Chest Wall:    No tenderness or deformity   Heart:    Regular rate and rhythm, S1 and S2 normal, no murmur, rub or gallop  Breast Exam:    No chest wall tenderness, masses or gynecomastia  Abdomen:     Soft, non-tender, nondistended, normoactive bowel sounds, no masses, no hepatosplenomegaly  Genitalia:    Normal male external genitalia without lesions. Testicles without masses.  No inguinal hernias.  Rectal:    Normal sphincter tone, no masses or tenderness; guaiac negative stool. Prostate smooth, not enlarged, nontender, no nodules.  Extremities:   No clubbing, cyanosis or edema  Pulses:   2+ and symmetric all extremities  Skin:   Skin color, texture, turgor normal, no rashes.   Lymph nodes:   Cervical, supraclavicular, and inguinal nodes normal  Neurologic:   Normal strength, sensation and gait; reflexes 2+ and symmetric throughout              Psych:   Normal mood, affect, hygiene and grooming.     Chemistry      Component Value Date/Time   NA 139 08/03/2022 0851   K 4.4 08/03/2022 0851   CL 103 08/03/2022 0851   CO2 20 08/03/2022 0851   BUN 18 08/03/2022 0851   CREATININE 1.18 08/03/2022 0851   CREATININE 1.04 09/28/2014 0001      Component Value Date/Time   CALCIUM 9.5 08/03/2022 0851   ALKPHOS 72 08/03/2022 0851   AST 32 08/03/2022 0851   ALT 60 (H) 08/03/2022 0851   BILITOT 0.5 08/03/2022 0851     Fasting glu 99 Vitamin D-OH 38.2  Lab Results  Component Value Date   WBC 4.8 08/03/2022    HGB 14.6 08/03/2022   HCT 43.6 08/03/2022   MCV 88 08/03/2022   PLT 206 08/03/2022   Lab Results  Component Value Date   CHOL 166 08/03/2022   HDL 47 08/03/2022   LDLCALC 101 (H) 08/03/2022   TRIG 95 08/03/2022   CHOLHDL 3.5 08/03/2022     ASSESSMENT/PLAN:  Flu shot this year? COVID booster? (Decline if doesn't want) Shingrix age 4  Doesn't look like colonoscopy from 4/4 has been scanned yet, just abstracted. Hopefully it gets scanned in and attached at some point!  RF atorvastatin for 1 year  Discussed PSA screening starting at age 9, recommended at least 30 minutes of aerobic activity at least 5 days/week, weight bearing exercise at least 2x/week; proper sunscreen use reviewed; healthy diet and alcohol recommendations (less than or equal to 2 drinks/day) reviewed; regular seatbelt use; changing batteries in smoke detectors.  Immunization recommendations discussed--yearly flu  shots are recommended.   COVID booster  Colonoscopy recommendations reviewed, up to date.  F/u 1 year for CPE  (Labs next year--Cbc, c-met, lipid, PSA, and D if not taking same supplements)

## 2022-08-08 NOTE — Patient Instructions (Incomplete)
  HEALTH MAINTENANCE RECOMMENDATIONS:  It is recommended that you get at least 30 minutes of aerobic exercise at least 5 days/week (for weight loss, you may need as much as 60-90 minutes). This can be any activity that gets your heart rate up. This can be divided in 10-15 minute intervals if needed, but try and build up your endurance at least once a week.  Weight bearing exercise is also recommended twice weekly.  Eat a healthy diet with lots of vegetables, fruits and fiber.  "Colorful" foods have a lot of vitamins (ie green vegetables, tomatoes, red peppers, etc).  Limit sweet tea, regular sodas and alcoholic beverages, all of which has a lot of calories and sugar.  Up to 2 alcoholic drinks daily may be beneficial for men (unless trying to lose weight, watch sugars).  Drink a lot of water.  Sunscreen of at least SPF 30 should be used on all sun-exposed parts of the skin when outside between the hours of 10 am and 4 pm (not just when at beach or pool, but even with exercise, golf, tennis, and yard work!)  Use a sunscreen that says "broad spectrum" so it covers both UVA and UVB rays, and make sure to reapply every 1-2 hours.  Remember to change the batteries in your smoke detectors when changing your clock times in the spring and fall.  Carbon monoxide detectors are recommended for your home.  Use your seat belt every time you are in a car, and please drive safely and not be distracted with cell phones and texting while driving.  Routine eye exam is recommended.  Let us know your dose of Vitamin D3.  If you are taking 5000 IU, 3x/week should be fine. If it is 2000 IU, you will need to take that daily from November through March (think time change) vs just taking 2000 IU every day.   Take the meloxicam once daily with food until your pain has resolved (often 7-10 days, okay to take the full bottle if needed). Do not use any ibuprofen/advil/motrin/aleve/naproxen or Goody/BC powder while on the  meloxicam. You can take tylenol. If your pain isn't better after the course of anti-inflammatory, next step is an xray (go to [facility] at [facility]), and possibly referral for podiatrist.

## 2022-08-09 ENCOUNTER — Encounter: Payer: Self-pay | Admitting: Family Medicine

## 2022-08-09 ENCOUNTER — Ambulatory Visit: Payer: 59 | Admitting: Family Medicine

## 2022-08-09 VITALS — BP 110/70 | HR 64 | Ht 76.0 in | Wt 288.8 lb

## 2022-08-09 DIAGNOSIS — E559 Vitamin D deficiency, unspecified: Secondary | ICD-10-CM | POA: Diagnosis not present

## 2022-08-09 DIAGNOSIS — Z0000 Encounter for general adult medical examination without abnormal findings: Secondary | ICD-10-CM | POA: Diagnosis not present

## 2022-08-09 DIAGNOSIS — F1010 Alcohol abuse, uncomplicated: Secondary | ICD-10-CM

## 2022-08-09 DIAGNOSIS — M79672 Pain in left foot: Secondary | ICD-10-CM

## 2022-08-09 DIAGNOSIS — Z6835 Body mass index (BMI) 35.0-35.9, adult: Secondary | ICD-10-CM | POA: Diagnosis not present

## 2022-08-09 DIAGNOSIS — E7800 Pure hypercholesterolemia, unspecified: Secondary | ICD-10-CM

## 2022-08-09 LAB — POCT URINALYSIS DIP (PROADVANTAGE DEVICE)
Bilirubin, UA: NEGATIVE
Blood, UA: NEGATIVE
Glucose, UA: NEGATIVE mg/dL
Ketones, POC UA: NEGATIVE mg/dL
Leukocytes, UA: NEGATIVE
Nitrite, UA: NEGATIVE
Protein Ur, POC: NEGATIVE mg/dL
Specific Gravity, Urine: 1.025
Urobilinogen, Ur: 0.2
pH, UA: 6 (ref 5.0–8.0)

## 2022-08-09 MED ORDER — ATORVASTATIN CALCIUM 20 MG PO TABS: 20.0000 mg | ORAL_TABLET | Freq: Every day | ORAL | 3 refills | Status: DC

## 2022-08-09 MED ORDER — MELOXICAM 15 MG PO TABS
ORAL_TABLET | ORAL | 0 refills | Status: DC
Start: 2022-08-09 — End: 2022-09-01

## 2022-08-10 NOTE — Progress Notes (Signed)
It is up front, they are behind. It will get in chart eventually per [facility].

## 2022-08-28 ENCOUNTER — Encounter: Payer: Self-pay | Admitting: Family Medicine

## 2022-09-01 ENCOUNTER — Ambulatory Visit: Payer: 59 | Admitting: Medical

## 2022-09-01 VITALS — BP 110/64 | HR 74 | Temp 97.4°F | Wt 283.4 lb

## 2022-09-01 DIAGNOSIS — J019 Acute sinusitis, unspecified: Secondary | ICD-10-CM

## 2022-09-01 MED ORDER — PREDNISONE 20 MG PO TABS: ORAL_TABLET | ORAL | 0 refills | Status: DC

## 2022-09-01 MED ORDER — AMOXICILLIN-POT CLAVULANATE 875-125 MG PO TABS: 1.0000 | ORAL_TABLET | Freq: Two times a day (BID) | ORAL | 0 refills | Status: DC

## 2022-09-01 NOTE — Progress Notes (Signed)
Subjective:  Scott Hensley is a 52 y.o. male who presents for  Chief Complaint  Patient presents with   Sinus Problem    Sinus infection- strong smell in nose- mucous- greenish/yellow-grayish. Had augumetin- week and helps but still having issues   Symptoms include possible sinus infection x 2 weeks.  Started with a head cold and fever, but not having strong smell in nose, mucous that is green/yellow.  Called televisit, was on Augmentin 7 days and ipratroprrium bromide spray.   Much better, but still not fully resolved.  Still has mucous, strong smell and bad breath.  Nonsmoker.  No fever in a few days. No Sob.  Has mild sore throat.  Used some mucinex initially, and has tried some sudafed.  No other aggravating or relieving factors.  No other complaint.    Past Medical History:  Diagnosis Date   Constipation    Deviated septum    Diverticulosis 10/2010   Dr. Randa Evens, seen on CT; also noted on colonoscopy 4/24   GERD (gastroesophageal reflux disease)    Hemorrhoids    Hyperlipidemia    IBS (irritable bowel syndrome)    Dr. Randa Evens   Swallowing difficulty    Vitamin A deficiency    Vitamin D deficiency     Current Outpatient Medications on File Prior to Visit  Medication Sig Dispense Refill   atorvastatin (LIPITOR) 20 MG tablet Take 1 tablet (20 mg total) by mouth daily. 90 tablet 3   cetirizine (ZYRTEC) 10 MG tablet Take 10 mg by mouth daily.     Cholecalciferol (VITAMIN D) 50 MCG (2000 UT) CAPS Take 1 capsule by mouth 3 (three) times a week.     Psyllium (METAMUCIL PO) Take 2 Scoops by mouth daily.     No current facility-administered medications on file prior to visit.    ROS as in subjective   Objective: BP 110/64   Pulse 74   Temp (!) 97.4 F (36.3 C)   Wt 283 lb 6.4 oz (128.5 kg)   BMI 34.50 kg/m   General appearance: Alert, well developed, well nourished, no distress                             Skin: warm, no rash                           Head: no sinus  tenderness,                            Eyes: conjunctiva pink, corneas clear                            Ears: flat left tympanic membrane, flat right tympanic membrane, external ear canals normal                          Nose: septum midline, turbinates swollen, with erythema and mucoid discharge             Mouth/throat: MMM, tongue normal, mild pharyngeal erythema                           Neck: supple, no adenopathy, no thyromegaly, non tender  Lungs: clear, no wheezes, no rales, no rhonchi        Assessment  Encounter Diagnosis  Name Primary?   Acute sinusitis, recurrence not specified, unspecified location Yes      Plan: Discussed diagnosis of sinusitis.   Discussed usual time frame to see improvement. Discussed possible complications or symptoms that would prompt call back or recheck within the next few days.     He already completed 7 days of Augmentin.  Will go another 3 to 5 days of Augmentin.  Can use prednisone if desired.  We discussed the potential benefits of prednisone versus risk.  Continue a few more days of the Nettie pot nasal saline as well as Sudafed twice daily for a few more days.  Continue good water intake.  If not fully resolved within the next 3 to 5 days and let us know  Patient was advised to call or recheck if worse or not improving.    Patient voiced understanding of diagnosis, recommendations, and treatment plan.  Scott Hensley was seen today for sinus problem.  Diagnoses and all orders for this visit:  Acute sinusitis, recurrence not specified, unspecified location  Other orders -     amoxicillin-clavulanate (AUGMENTIN) 875-125 MG tablet; Take 1 tablet by mouth 2 (two) times daily. -     predniSONE (DELTASONE) 20 MG tablet; 3 tablets today, 2 tablets tomorrow, 1 tablet the third day   F/u prn

## 2022-09-07 ENCOUNTER — Other Ambulatory Visit: Payer: Self-pay | Admitting: Medical

## 2022-09-07 MED ORDER — DOXYCYCLINE HYCLATE 100 MG PO TABS: 100.0000 mg | ORAL_TABLET | Freq: Two times a day (BID) | ORAL | 0 refills | Status: DC

## 2022-12-03 ENCOUNTER — Encounter: Payer: Self-pay | Admitting: Family Medicine

## 2022-12-04 NOTE — Telephone Encounter (Signed)
Spoke with patient and offered him virtual. He said he will speak with his wife and call back if he would like the virtual.

## 2023-08-11 NOTE — Progress Notes (Unsigned)
 No chief complaint on file.  Scott Hensley is a 53 y.o. male who presents for a complete physical and follow-up on chronic problems.  Sinus problems--he saw Jimmye Moulds in late 08/2022 with a sinus infection.  He had ongoing issues and ultimately saw ENT, Dr. Donalee Fruits.  CT 10/2022: IMPRESSION:  1. 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  2. Underlying sinus hyperplasia.   Dr. Donalee Fruits reported to patient--There is complete opacity of the left maxillary and part of the left ethmoid complex. Remaining sinuses are clear. There is evidence of fungal disease as well. Options discussed including 3 weeks of clindamycin versus endoscopic surgery. He would like to try the antibiotic.   Obesity-- At one point he had gotten down to 225, but weight was noted to be back up to 291 in 05/2022.  He had gotten down to 283# last year with walking 5 miles/day for [place], and abstaining from alcohol during that time.  He didn't weight himself, but felt vetter.  He remained drinking less, up to 5-6 beers just once a week.  (He previously had gone to [facility], stopped in 11/2019) His wife had been on [facility] Readings from Last 3 Encounters:  09/01/22 283 lb 6.4 oz (128.5 kg)  08/09/22 288 lb 12.8 oz (131 kg)  05/25/22 291 lb 6.4 oz (132.2 kg)   09/29/20 250 lb 9.6 oz (113.7 kg)  11/12/19 231 lb (104.8 kg)  10/08/19 227 lb (103 kg)  Wt 285# in 09/2018   Hyperlipidemia:  He is compliant with taking atorvastatin  20mg  daily, without side effects. He is following a lowfat, low cholesterol diet. Eating mostly chicken.  1 egg and [place] sausage often (what he ate when on [facility] clinic diet).  Doesn't eat much cheese. Infrequent red meat (only when he eats [place]). Lipids were at goal last year, due for recheck. Lab Results  Component Value Date   CHOL 166 08/03/2022   HDL 47 08/03/2022   LDLCALC 101 (H) 08/03/2022   TRIG 95 08/03/2022   CHOLHDL 3.5 08/03/2022    History of vitamin D  deficiency:  Vitamin D -OH  level was low at 9.9 in 04/2018.  It was low again in 09/2020 when not taking supplements, improved to 50.5 after a prescription course. At last check in 07/2022 he took a supplement (either 2000 or 5000 IU) daily x 5-6 weeks. He is currently taking   Component Ref Range & Units (hover) 1 yr ago (08/03/22) 2 yr ago (12/29/20) 2 yr ago (09/29/20) 3 yr ago (08/13/19) 4 yr ago (04/16/19) 4 yr ago (09/19/18) 5 yr ago (04/29/18)  Vit D, 25-Hydroxy 38.2 50.5 CM 25.7 Low  CM 49.2 CM 44.5 CM 30.4 CM 9.9 Low  CM    H/o prostatitis in 03/2021.  He denies any urinary/prostate complaints currently. Feels like he is sitting on a golf ball (pressure in the perineal area) when he sits for long periods of time (car rides).  Slight hesitancy only when dehydrated.  He saw GI in the past for abdominal and back complaints, with constipation. He was prescribed robinul for abdominal pain (anti-spasmodic), which was helpful.   He hasn't needed any anti-spasmodic in a very long time.   He takes metamucil daily, bowels are regular.   He gets a discomfort at his left side of his back, described as a "gassy bloat"--notices this mainly related to his weight (had resolved, recurred when he gained weight), also with sodas (which he rarely drinks).   Immunization History  Administered Date(s) Administered   Influenza,inj,Quad PF,6+ Mos 04/29/2018, 01/13/2019, 03/16/2021   Influenza-Unspecified 01/09/2020   Moderna SARS-COV2 Booster Vaccination 09/29/2020   Moderna Sars-Covid-2 Vaccination 06/15/2019, 07/13/2019   Tdap 10/07/2014   He plans to get another COVID shot in the Fall, declines one today. Last colonoscopy: 07/2022--tubular adenoma and hyperplastic polyp. 5 yr f/u rec (Dr. Denece Finger). Prior was 09/2010, diverticulosis noted.  Lab Results  Component Value Date   PSA1 0.6 09/19/2018  Dentist: twice a year Ophtho: never;  uses reading glasses Exercise:  ***    PMH, PSH, SH and FH were reviewed and  updated    ROS:  The patient denies anorexia, fever, headaches, decreased hearing, ear pain, hoarseness, chest pain, palpitations, dizziness, syncope, dyspnea on exertion, cough, swelling, nausea, vomiting, diarrhea, constipation, melena, hematochezia, indigestion/heartburn, hematuria, incontinence, erectile dysfunction, nocturia, weakened urine stream (only when dehydrated), dysuria, genital lesions, numbness, tingling, weakness, tremor, suspicious skin lesions, depression, anxiety, abnormal bleeding/bruising, or enlarged lymph nodes Allergies are controlled with daily zyrtec.  Weight Foot pain? Sinus issues?   PHYSICAL EXAM:  There were no vitals taken for this visit.  Wt Readings from Last 3 Encounters:  09/01/22 283 lb 6.4 oz (128.5 kg)  08/09/22 288 lb 12.8 oz (131 kg)  05/25/22 291 lb 6.4 oz (132.2 kg)   General Appearance:    Alert, cooperative, no distress, appears stated age  Head:    Normocephalic, without obvious abnormality, atraumatic  Eyes:    PERRL, conjunctiva/corneas clear, EOM's intact, fundi benign.  Ears:    Normal TM's and external ear canals bilaterally.   Nose:  No drainage or sinus tenderness  Throat:  Normal mucosa  Neck:   Supple, no lymphadenopathy;  thyroid : no enlargement/ tenderness/nodules; no carotid bruit or JVD  Back:    Spine nontender, no curvature, ROM normal, no CVA tenderness  Lungs:     Clear to auscultation bilaterally without wheezes, rales or  ronchi; respirations unlabored  Chest Wall:    No tenderness or deformity   Heart:    Regular rate and rhythm, S1 and S2 normal, no murmur, rub or gallop  Breast Exam:    No chest wall tenderness, masses or gynecomastia  Abdomen:     Soft, non-tender, nondistended, normoactive bowel sounds, no masses, no hepatosplenomegaly  Genitalia:    Normal male external genitalia without lesions. Testicles without masses.  No inguinal hernias.  Rectal:    Normal sphincter tone, no masses or tenderness; guaiac  negative stool. Prostate smooth, not enlarged, nontender, no nodules. Noninflamed external hemorrhoids present.  Extremities:   No clubbing, cyanosis or edema. No STS or bruising at area of discomfort on top of foot.  No bony tenderness, but tender at soft tissue between 4th and 5th distal metatarsals on top of foot.  No erythema or warmth. Some pain with eversion against resistance, and with stretching of the top of the foot (inversion).  Pulses:   2+ and symmetric all extremities  Skin:   Skin color, texture, turgor normal, no rashes.   Lymph nodes:   Cervical, supraclavicular, and inguinal nodes normal  Neurologic:   Normal strength, sensation and gait; reflexes 2+ and symmetric throughout              Psych:   Normal mood, affect, hygiene and grooming.    ***UPDATE noninflamed ext hemorrhodis?     ASSESSMENT/PLAN:  Any flu or further COVID shots?  Shingrix, prevnar-20 (not both) due  D if not taking same supplements Need to  verify dose he is taking  Discussed PSA screening starting at age 5, recommended at least 30 minutes of aerobic activity at least 5 days/week, weight bearing exercise at least 2x/week; proper sunscreen use reviewed; healthy diet and alcohol recommendations (less than or equal to 2 drinks/day) reviewed; regular seatbelt use; changing batteries in smoke detectors.  Immunization recommendations discussed--yearly flu shots are recommended.   COVID boosters recommended. Prevnar-20 *** Shingrix recommended, discussed risks/SE and timing. *** Tdap next year.  Colonoscopy recommendations reviewed, up to date.  F/u 1 year for CPE

## 2023-08-11 NOTE — Patient Instructions (Incomplete)
  HEALTH MAINTENANCE RECOMMENDATIONS:  It is recommended that you get at least 30 minutes of aerobic exercise at least 5 days/week (for weight loss, you may need as much as 60-90 minutes). This can be any activity that gets your heart rate up. This can be divided in 10-15 minute intervals if needed, but try and build up your endurance at least once a week.  Weight bearing exercise is also recommended twice weekly.  Eat a healthy diet with lots of vegetables, fruits and fiber.  "Colorful" foods have a lot of vitamins (ie green vegetables, tomatoes, red peppers, etc).  Limit sweet tea, regular sodas and alcoholic beverages, all of which has a lot of calories and sugar.  Up to 2 alcoholic drinks daily may be beneficial for men (unless trying to lose weight, watch sugars).  Drink a lot of water.  Sunscreen of at least SPF 30 should be used on all sun-exposed parts of the skin when outside between the hours of 10 am and 4 pm (not just when at beach or pool, but even with exercise, golf, tennis, and yard work!)  Use a sunscreen that says "broad spectrum" so it covers both UVA and UVB rays, and make sure to reapply every 1-2 hours.  Remember to change the batteries in your smoke detectors when changing your clock times in the spring and fall.  Carbon monoxide detectors are recommended for your home.  Use your seat belt every time you are in a car, and please drive safely and not be distracted with cell phones and texting while driving.   I recommend getting the new shingles vaccine (Shingrix). You can schedule a nurse visit at our office when convenient (based on the possible side effects as discussed), or get through the pharmacy (there may be a difference in cost, you can check with your insurance or pharmacy to see). This is a series of 2 injections, spaced 2 months apart.  I wouldn't get this the same day as another vaccine (due to the side effects), but instead separate it by 2 weeks from other  vaccines.  Please get flu shots in the Fall. Consider COVID boosters yearly (the new/updated ones usually come out in the Fall). You will be due for a tetanus booster next year. You can come for a pneumonia shot as a nurse visit, or wait until next year, or get from the pharmacy (either Prevnar-20 or Capvaxive).  Schedule a routine eye exam.  Expect to need a prescription of vitamin D  (or be told the amount you should be taking) as well as higher cholesterol, based on you not taking these as regularly. I will give recommendations with your results on MyChart.

## 2023-08-13 ENCOUNTER — Encounter: Payer: Self-pay | Admitting: Family Medicine

## 2023-08-13 ENCOUNTER — Ambulatory Visit: Payer: 59 | Admitting: Family Medicine

## 2023-08-13 VITALS — BP 118/70 | HR 60 | Ht 77.0 in | Wt 284.4 lb

## 2023-08-13 DIAGNOSIS — E66811 Obesity, class 1: Secondary | ICD-10-CM | POA: Diagnosis not present

## 2023-08-13 DIAGNOSIS — Z23 Encounter for immunization: Secondary | ICD-10-CM

## 2023-08-13 DIAGNOSIS — M79672 Pain in left foot: Secondary | ICD-10-CM

## 2023-08-13 DIAGNOSIS — Z125 Encounter for screening for malignant neoplasm of prostate: Secondary | ICD-10-CM

## 2023-08-13 DIAGNOSIS — E559 Vitamin D deficiency, unspecified: Secondary | ICD-10-CM

## 2023-08-13 DIAGNOSIS — J358 Other chronic diseases of tonsils and adenoids: Secondary | ICD-10-CM

## 2023-08-13 DIAGNOSIS — E7800 Pure hypercholesterolemia, unspecified: Secondary | ICD-10-CM | POA: Diagnosis not present

## 2023-08-13 DIAGNOSIS — F1010 Alcohol abuse, uncomplicated: Secondary | ICD-10-CM | POA: Diagnosis not present

## 2023-08-13 DIAGNOSIS — Z0000 Encounter for general adult medical examination without abnormal findings: Secondary | ICD-10-CM | POA: Diagnosis not present

## 2023-08-13 DIAGNOSIS — E6609 Other obesity due to excess calories: Secondary | ICD-10-CM

## 2023-08-13 DIAGNOSIS — Z6833 Body mass index (BMI) 33.0-33.9, adult: Secondary | ICD-10-CM

## 2023-08-13 LAB — LIPID PANEL

## 2023-08-13 MED ORDER — ATORVASTATIN CALCIUM 20 MG PO TABS
20.0000 mg | ORAL_TABLET | Freq: Every day | ORAL | 3 refills | Status: AC
Start: 2023-08-13 — End: ?

## 2023-08-14 ENCOUNTER — Encounter: Payer: Self-pay | Admitting: Family Medicine

## 2023-08-14 LAB — LIPID PANEL
Cholesterol, Total: 255 mg/dL — ABNORMAL HIGH (ref 100–199)
HDL: 52 mg/dL (ref >39)
LDL CALC COMMENT:: 4.9 ratio (ref 0.0–5.0)
LDL Chol Calc (NIH): 178 mg/dL — ABNORMAL HIGH (ref 0–99)
Triglycerides: 139 mg/dL (ref 0–149)
VLDL Cholesterol Cal: 25 mg/dL (ref 5–40)

## 2023-08-14 LAB — CMP14+EGFR
ALT: 35 IU/L (ref 0–44)
AST: 24 IU/L (ref 0–40)
Albumin: 4.8 g/dL (ref 4.1–5.1)
Alkaline Phosphatase: 86 IU/L (ref 44–121)
BUN/Creatinine Ratio: 19 (ref 9–20)
BUN: 22 mg/dL (ref 6–24)
Bilirubin Total: 0.6 mg/dL (ref 0.0–1.2)
CO2: 22 mmol/L (ref 20–29)
Calcium: 9.8 mg/dL (ref 8.7–10.2)
Chloride: 104 mmol/L (ref 96–106)
Creatinine, Ser: 1.18 mg/dL (ref 0.76–1.27)
Globulin, Total: 2.5 g/dL (ref 1.5–4.5)
Glucose: 100 mg/dL — ABNORMAL HIGH (ref 70–99)
Potassium: 4.5 mmol/L (ref 3.5–5.2)
Sodium: 140 mmol/L (ref 134–144)
Total Protein: 7.3 g/dL (ref 6.0–8.5)
eGFR: 75 mL/min/{1.73_m2} (ref >59)

## 2023-08-14 LAB — CBC WITH DIFFERENTIAL/PLATELET
Basophils Absolute: 0.1 10*3/uL (ref 0.0–0.2)
Basos: 1 %
EOS (ABSOLUTE): 0.2 10*3/uL (ref 0.0–0.4)
Eos: 3 %
Hematocrit: 45.6 % (ref 37.5–51.0)
Hemoglobin: 15.2 g/dL (ref 13.0–17.7)
Immature Grans (Abs): 0 10*3/uL (ref 0.0–0.1)
Immature Granulocytes: 0 %
Lymphocytes Absolute: 1.7 10*3/uL (ref 0.7–3.1)
Lymphs: 32 %
MCH: 28.8 pg (ref 26.6–33.0)
MCHC: 33.3 g/dL (ref 31.5–35.7)
MCV: 86 fL (ref 79–97)
Monocytes Absolute: 0.5 10*3/uL (ref 0.1–0.9)
Monocytes: 9 %
Neutrophils Absolute: 2.9 10*3/uL (ref 1.4–7.0)
Neutrophils: 55 %
Platelets: 260 10*3/uL (ref 150–450)
RBC: 5.28 x10E6/uL (ref 4.14–5.80)
RDW: 12.2 % (ref 11.6–15.4)
WBC: 5.3 10*3/uL (ref 3.4–10.8)

## 2023-08-14 LAB — PSA: Prostate Specific Ag, Serum: 0.6 ng/mL (ref 0.0–4.0)

## 2023-08-14 LAB — VITAMIN D 25 HYDROXY (VIT D DEFICIENCY, FRACTURES): Vit D, 25-Hydroxy: 30.2 ng/mL (ref 30.0–100.0)

## 2023-08-14 NOTE — Progress Notes (Signed)
 Message sent to pt to  schedule  6 month fasting med check.

## 2023-08-27 ENCOUNTER — Ambulatory Visit: Payer: Self-pay | Admitting: Internal Medicine

## 2023-10-19 ENCOUNTER — Other Ambulatory Visit (INDEPENDENT_AMBULATORY_CARE_PROVIDER_SITE_OTHER)

## 2023-10-19 DIAGNOSIS — Z23 Encounter for immunization: Secondary | ICD-10-CM | POA: Diagnosis not present

## 2023-10-29 ENCOUNTER — Other Ambulatory Visit: Payer: Self-pay | Admitting: *Deleted

## 2023-10-29 DIAGNOSIS — Z5181 Encounter for therapeutic drug level monitoring: Secondary | ICD-10-CM

## 2023-10-29 DIAGNOSIS — E7800 Pure hypercholesterolemia, unspecified: Secondary | ICD-10-CM

## 2024-02-11 ENCOUNTER — Other Ambulatory Visit: Payer: Self-pay

## 2024-02-11 DIAGNOSIS — E7800 Pure hypercholesterolemia, unspecified: Secondary | ICD-10-CM

## 2024-02-11 DIAGNOSIS — Z5181 Encounter for therapeutic drug level monitoring: Secondary | ICD-10-CM

## 2024-02-11 LAB — LIPID PANEL

## 2024-02-12 LAB — LIPID PANEL
Cholesterol, Total: 174 mg/dL (ref 100–199)
HDL: 52 mg/dL (ref >39)
LDL CALC COMMENT:: 3.3 ratio (ref 0.0–5.0)
LDL Chol Calc (NIH): 106 mg/dL — AB (ref 0–99)
Triglycerides: 88 mg/dL (ref 0–149)
VLDL Cholesterol Cal: 16 mg/dL (ref 5–40)

## 2024-02-12 LAB — HEPATIC FUNCTION PANEL
ALT: 29 IU/L (ref 0–44)
AST: 20 IU/L (ref 0–40)
Albumin: 4.3 g/dL (ref 3.8–4.9)
Alkaline Phosphatase: 82 IU/L (ref 47–123)
Bilirubin Total: 0.5 mg/dL (ref 0.0–1.2)
Bilirubin, Direct: 0.15 mg/dL (ref 0.00–0.40)
Total Protein: 6.9 g/dL (ref 6.0–8.5)

## 2024-02-12 NOTE — Progress Notes (Unsigned)
 No chief complaint on file.  Patient presents for 6 month follow-up.  He had labs done prior to visit.  Hyperlipidemia:  At his physical he reported only taking his atorvastatin  2-3x/week.  Lipids were elevated.  He was encouraged to use a pill box, and we reviewed low cholesterol diet.  He reports compliance in taking atorvastatin  daily, and denies side effects.  He continues to eat  mostly chicken.  Turkey sausage often, no longer having as many eggs.  ***UPDATE EGGS  Doesn't eat much cheese. Infrequent red meat (only when he eats [place]).  Component Ref Range & Units (hover) 1 d ago (02/11/24) 6 mo ago (08/13/23) 1 yr ago (08/03/22) 3 yr ago (12/29/20) 3 yr ago (09/29/20) 4 yr ago (08/13/19) 4 yr ago (04/16/19)  Cholesterol, Total 174 255 High  166 201 High  261 High  166 191  Triglycerides 88 139 95 102 128 62 69  HDL 52 52 47 56 73 59 50  VLDL Cholesterol Cal 16 25 18 18 22 12 13   LDL Chol Calc (NIH) 106 High  178 High  101 High  127 High  166 High  95 128 High   Chol/HDL Ratio 3.3 4.9 CM 3.5 CM 3.6 CM 3.6 CM        History of vitamin D  deficiency:  Vitamin D -OH level was low at 9.9 in 04/2018.  It was low again in 09/2020 when not taking supplements, improved to 50.5 after a prescription course. Level was 30.2 on last check in 08/2023. At that time, he hadn't taken any vitamin D  in about 9 months.  He was encouraged to take a daily supplement. He is currently taking ***     Obesity-- LAST YEAR__UPDATE ALL *** At one point he had gotten down to 225, but weight was noted to be back up to 291 in 05/2022.  He had gotten down to 283# last year with walking 5 miles/day for [place], and abstaining from alcohol during that time.  He remained drinking less, up to 5-6 beers just once a week.   Today he states that he doesn't think his diet is that bad--turkey sausage and an egg for breakfast (eggs are less often), sugar-free jellies.  Eats a lot of chicken. Wife makes a lot of pasta, has a lot of  carbs with dinner. Feels better if not eating bread daily. If he makes sandwich, he uses the bread recommended by [facility]. Not whole grain if per wife. He gave up alcohol again for lent, had some since then, but overall less often.  Tends to still drink 5-6 beers at a time, but can go weeks without any. Now having 1 coffee/day (with 2% milk). (He previously had gone to [facility], stopped in 11/2019) His wife had been on [place], regained weight when stopped.   Wt Readings from Last 3 Encounters:  08/13/23 284 lb 6.4 oz (129 kg)  09/01/22 283 lb 6.4 oz (128.5 kg)  08/09/22 288 lb 12.8 oz (131 kg)  03/2021 271 lb 12.8 oz  09/29/20 250 lb 9.6 oz (113.7 kg)  11/12/19 231 lb (104.8 kg)  10/08/19 227 lb (103 kg)  Wt 285# in 09/2018         PMH, PSH, SH reviewed   ROS:    PHYSICAL EXAM:  There were no vitals taken for this visit.  Wt Readings from Last 3 Encounters:  08/13/23 284 lb 6.4 oz (129 kg)  09/01/22 283 lb 6.4 oz (128.5 kg)  08/09/22 288 lb  12.8 oz (131 kg)      Lab Results  Component Value Date   CHOL 174 02/11/2024   HDL 52 02/11/2024   LDLCALC 106 (H) 02/11/2024   TRIG 88 02/11/2024   CHOLHDL 3.3 02/11/2024   Lab Results  Component Value Date   ALT 29 02/11/2024   AST 20 02/11/2024   ALKPHOS 82 02/11/2024   BILITOT 0.5 02/11/2024     ASSESSMENT/PLAN:  Might need to add D level if not taking a supplement   Flu, COVID (decline if doesn't want) Prevnar-20

## 2024-02-13 ENCOUNTER — Encounter: Payer: Self-pay | Admitting: Family Medicine

## 2024-02-13 ENCOUNTER — Ambulatory Visit: Payer: Self-pay | Admitting: Family Medicine

## 2024-02-13 VITALS — BP 128/78 | HR 72 | Ht 77.0 in | Wt 291.6 lb

## 2024-02-13 DIAGNOSIS — E66811 Obesity, class 1: Secondary | ICD-10-CM

## 2024-02-13 DIAGNOSIS — Z6834 Body mass index (BMI) 34.0-34.9, adult: Secondary | ICD-10-CM

## 2024-02-13 DIAGNOSIS — E7800 Pure hypercholesterolemia, unspecified: Secondary | ICD-10-CM

## 2024-02-13 DIAGNOSIS — Z23 Encounter for immunization: Secondary | ICD-10-CM

## 2024-02-13 DIAGNOSIS — E559 Vitamin D deficiency, unspecified: Secondary | ICD-10-CM | POA: Diagnosis not present

## 2024-02-13 DIAGNOSIS — E6609 Other obesity due to excess calories: Secondary | ICD-10-CM

## 2024-02-13 NOTE — Patient Instructions (Signed)
 Use eye drops for allergies as needed (zaditor, patanol, etc used to be prescription only.  Things like Naphcon-A work well too, just need to be used more often). Continue the cetirizine as needed. Consider Flonase daily if you develop more nasal congestion (this can sometimes help the eyes as well). Using Refresh (os systane, natural tears, etc) -- moisturizing drops can also be very helpful for the eye symptoms.

## 2024-04-04 ENCOUNTER — Telehealth: Payer: Self-pay | Admitting: Family Medicine

## 2024-04-04 NOTE — Telephone Encounter (Signed)
 Copied from CRM #[id number]. Topic: General - Billing Inquiry >> Apr 04, 2024 11:54 AM Tinnie C wrote: Reason for CRM: Pt had come in to speak with the nurse to get his labs explained, get vaccines and they also went over medications while there. His insurance is trying to charge him over $200 due to the way this is coded, stating it was coded as a non-preventative medication management office visit. They say if it is coded as preventative, it would be covered completely. He has already confirmed this is this case with his insurance company and spoken to billing with [facility]. Pt is wondering if someone could look into this and give him a call back at [phone]  Returned pt call Left message that $245 balance was not for vaccines but for his office visit with Dr Randol and that portion was applied to his deductible

## 2024-04-07 ENCOUNTER — Encounter: Payer: Self-pay | Admitting: Family Medicine

## 2024-04-07 NOTE — Telephone Encounter (Signed)
 Copied from CRM #[id number]. Topic: General - Billing Inquiry >> Apr 07, 2024 11:42 AM Alfonso HERO wrote: Reason for CRM: patient returning call. Asking for a callback.  Spoke to pt re balance from last visit and why visit could not be coded as preventative. Pt expressed understanding of the overall concept but was still frustrated that he had to come back in for another visit when nothing was changed and wanted to know how to avoid this going forward. I advised patient to make Dr Randol aware of his concerns going forward with future visits

## 2024-09-08 ENCOUNTER — Encounter: Payer: Self-pay | Admitting: Family Medicine
